# Patient Record
Sex: Female | Born: 1965 | Race: White | Hispanic: No | Marital: Single | Smoking: Current every day smoker
Health system: Southern US, Community
[De-identification: ages and names within clinical notes are randomized; demographics above are authoritative.]

## PROBLEM LIST (undated history)

## (undated) DIAGNOSIS — J439 Emphysema, unspecified: Secondary | ICD-10-CM

---

## 2004-04-17 ENCOUNTER — Inpatient Hospital Stay (HOSPITAL_COMMUNITY): Admission: AD | Admit: 2004-04-17 | Discharge: 2004-04-20 | Payer: Self-pay | Admitting: Psychiatry

## 2005-11-11 ENCOUNTER — Inpatient Hospital Stay (HOSPITAL_COMMUNITY): Admission: RE | Admit: 2005-11-11 | Discharge: 2005-11-14 | Payer: Self-pay | Admitting: Psychiatry

## 2005-11-12 ENCOUNTER — Ambulatory Visit: Payer: Self-pay | Admitting: Psychiatry

## 2008-06-02 ENCOUNTER — Emergency Department (HOSPITAL_COMMUNITY): Admission: EM | Admit: 2008-06-02 | Discharge: 2008-06-03 | Payer: Self-pay | Admitting: Emergency Medicine

## 2011-04-15 NOTE — Discharge Summary (Signed)
[name], [name] NO.:  [id number]   MEDICAL RECORD NO.:  [id number]                   PATIENT TYPE:  IPS   LOCATION:  0506                                 FACILITY:  [facility]   PHYSICIAN:  Geoffery Lyons, M.D.                   DATE OF BIRTH:  1966/02/22   DATE OF ADMISSION:  04/17/2004  DATE OF DISCHARGE:  04/20/2004                                 DISCHARGE SUMMARY   CHIEF COMPLAINT AND PRESENTING ILLNESS:  This was the first admission to  [facility]  for this 45 year old single white female who  lived in a relationship with a man for 13 years and had a child by him.  She  claims she does not know what happened.  She overdosed on her son's  prescribed medications, Darvocet, Neurontin, Zoloft, Risperdal, Celexa and  Wellbutrin.  She had been treated for depression the year before.  Her  younger son was in [place] and that created a lot of pressure for her.  Significant other found her at 8 p.m. on May 19 unresponsive.  Emergency  services were called, she was rushed to [facility].  She was  intubated.  She denied any memory of overdosing.   PAST PSYCHIATRIC HISTORY:  No prior psychiatric treatment, had been an  outpatient in [place].   ALCOHOL AND DRUG HISTORY:  Acknowledges smoking 1-2 packs per day.   PAST MEDICAL HISTORY:  Bulging disk, prescribed Neurontin and hydrocodone.   MEDICATIONS:  Zanaflex 4 mg daily, Zoloft 100 mg daily, Neurontin 300 mg,  and hydrocodone 10/625 mg daily.   PHYSICAL EXAMINATION:  Performed, failed to show any acute findings.   LABORATORY WORKUP:  CBC within normal limits.  Urine drug screen negative.  Blood chemistries were within normal limits.   MENTAL STATUS EXAM:  Revealed an alert, oriented female, scruffy, casual  clothes.  Speech normal rate, rhythm and tone.  Anxious, irritable.  Mood  was anxiety.  Thought processes were clear, rational and goal oriented,  wanting to be  discharged.  Denied any suicidal or homicidal ideations, no  evidence of delusions or hallucinations.  Cognition well preserved.   ADMISSION DIAGNOSES:   AXIS I:  Depressive disorder not otherwise specified.   AXIS II:  No diagnosis.   AXIS III:  Obesity, chronic back pain, aspiration pneumonia.   AXIS IV:  Moderate.   AXIS V:  Global assessment of function upon admission 35, highest global  assessment of function in past year 65-70.   COURSE IN HOSPITAL:  She was admitted and started on intensive individual  and group psychotherapy.  She was given trazodone for sleep.  She was kept  on the Neurontin 300 4 times a day and 600 at night, Zoloft 100 mg per day,  Zanaflex 4 mg every 8 hours, Darvocet 1 every 4 hours as needed for pain.  By May 22, she  claimed she was feeling a lot better, endorsed that the  stress of her son being in [place], still not clear what happened when she  took the overdose, but denied any suicidal or homicidal ideations.  She  worked on [employer].  She insisted that we  discharge, so we went and scheduled a family session with her husband.  She  was able to open up a little bit more in terms of the feelings.  Husband was  supportive and they both felt that she was ready to be discharged so we went  ahead and discharged to outpatient follow-up.   DISCHARGE DIAGNOSES:   AXIS I:  Depressive disorder not otherwise specified.   AXIS II:  No diagnosis.   AXIS III:  Obesity, chronic back pain, bulging disk.   AXIS IV:  Moderate.   AXIS V:  Global assessment of function upon discharge 55-60.   DISCHARGE MEDICATIONS:  1. Neurontin 300 mg 4 times a day and 600 at night.  2. Zoloft 100 mg daily.  3. Trazodone 100 mg at bedtime for sleep.   DISPOSITION:  Follow up [facility].                                               Geoffery Lyons, M.D.    IL/MEDQ  D:  05/12/2004  T:  05/12/2004  Job:  [id number]
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
## 2011-04-15 NOTE — Discharge Summary (Signed)
[name], KOPPENHAVER NO.:  [id number]   MEDICAL RECORD NO.:  [id number]          PATIENT TYPE:  IPS   LOCATION:  0300                          FACILITY:  [facility]   PHYSICIAN:  Geoffery Lyons, M.D.      DATE OF BIRTH:  [birth year]   DATE OF ADMISSION:  11/11/2005  DATE OF DISCHARGE:  11/14/2005                                 DISCHARGE SUMMARY   CHIEF COMPLAINT AND PRESENTING ILLNESS:  This was the first admission to  [facility] for this 45 year old divorced white female,  presented to the [facility] Emergency Room on November 07, 2005.  Boyfriend  had called for an ambulance after he found her unresponsive.  She had  overdosed on pain pills.  She ended up being intubated and stabilized at the  ICU, medically cleared.   PAST PSYCHIATRIC HISTORY:  Several prior overdoses, on in May 2005 and one  in March 2006.  The March overdose was on methadone.  Prior admission to  [facility], and she did have a prior admission to [facility] in 2005.   ALCOHOL AND DRUG HISTORY:  Smokes marijuana at least monthly.  No other  substances.   MEDICAL HISTORY:  Status post overdose, back pain with neuropathy.   MEDICATIONS:  1.  Neurontin 800 mg 3 times a day.  2.  Oxycodone 80 mg twice a day.  3.  Hydrocodone 10/325 four times a day as needed.  4.  Zanaflex __________ mg per day.   PHYSICAL EXAMINATION:  Performed, failed to show any acute findings.   LABORATORY WORKUP:  CBC:  White blood cells 11.4, hemoglobin 15.5.  Blood  chemistries:  Glucose 79.  Liver enzymes:  SGOT 23, SGPT 26, total bilirubin  0.6, hemoglobin A1C 6.3.  TSH 3.106.   MENTAL STATUS EXAM:  Reveals an alert, cooperative female, good eye contact.  Speech was normal in rate, rhythm and tone.  Mood was anxious, affect was  labile, cries easily.  Thought processes were clear, rational and goal  oriented. No active delusions, no acute suicidal or homicidal ideations.  No  hallucinations.  Cognition well preserved.   ADMISSION DIAGNOSES:  AXIS I:  Depressive disorder not otherwise specified,  rule out bipolar II, depressed.  AXIS II:  No diagnosis.  AXIS III:  Chronic back pain.  AXIS IV:  Moderate.  AXIS V:  Upon admission 25-30, highest global assessment of functioning in  the last year 60.   COURSE IN HOSPITAL:  She was admitted.  She was started in individual and  group psychotherapy.  She was given Ambien for sleep.  She was given  Neurontin 800 three times a day. Zanaflex __________ mg per day, Abilify 30  mg per day.  Zanaflex was clarified to Zanaflex 4 mg per day.  Ms. Haston  claimed that she took more pills than she was supposed to take but this was  not a suicide attempt, endorsed that she was taking more opioids than  prescribed, endorsed pain in her back, her legs, her feet.  UDS was negative  for  opioids.  She initially was very reserved, very guarded, not as  spontaneous, some psychomotor retardation, somatically focused.  She claimed  that she had been off the opioids for 5-6 days.  She did admit that she was  taking more than she was prescribed and she was abusing them, and that she  had overdosed on muscle relaxants, said she was wanting opioids for pain.  The husband had told her that if she was back on the opioids she would not  be welcome back.  She was committed to the relationship so she was willing  to try to make it work.  She continued to be somatically focused, anxious,  claimed pain.  On December 18, she was better.  She was in full contact with  reality, endorsed pain, but she was going to deal with the pain with ways  other than opioids.  She was willing not to go back on opioids for the sake  of maintaining the relationship with the husband.  She did admit that she  was abusing them.  The  husband had gotten rid of things she left in the  house.  At that particular time, she was in full contact with reality.  There were  no suicidal or homicidal ideas, no hallucinations, no delusions.  Completely detoxed.  She was willing to pursue further outpatient treatment.   DISCHARGE DIAGNOSES:  AXIS I:  Opioid dependence, depressive disorder not  otherwise specified, rule out bipolar II, depressed.  AXIS II:  No diagnosis.  AXIS III:  Chronic back pain.  AXIS IV:  Moderate.  AXIS V:  Upon discharge 50.   DISCHARGE MEDICATIONS:  1.  Levaquin 750 mg 1 daily.  2.  Augmentin 875 mg 1 twice a day for 6 more days as a preventive to      aspiration pneumonia.  3.  Protonix 40 mg per day.  4.  Ibuprofen 800 mg every 8 hours as needed for pain.  5.  Neurontin 800 one 3 times a day.  6.  Zanaflex 4 mg daily.  7.  Abilify 30 mg 1 daily.   FOLLOWUP:  To be followed at the local mental health center.      Geoffery Lyons, M.D.  Electronically Signed     IL/MEDQ  D:  11/24/2005  T:  11/24/2005  Job:  [id number]

## 2011-04-15 NOTE — H&P (Signed)
[name], [name]                          ACCOUNT NO.:  [id number]   MEDICAL RECORD NO.:  [id number]                   PATIENT TYPE:  IPS   LOCATION:  0506                                 FACILITY:  [facility]   PHYSICIAN:  Geoffery Lyons, M.D.                   DATE OF BIRTH:  [birth year]   DATE OF ADMISSION:  04/17/2004  DATE OF DISCHARGE:                         PSYCHIATRIC ADMISSION ASSESSMENT   IDENTIFYING INFORMATION:  This is an involuntary admission.  This is a 45-  year-old single white female who has lived with the same man for 13 years  and has a child by him.   REASON FOR ADMISSION AND SYMPTOMS:  The patient does not remember what  happened.  She apparently overdosed on her and son's prescribed medications,  Darvocet, Neurontin, Zoloft, Risperdal, Celexa and Wellbutrin.  She has been  treated for depression in the past year.  Her younger son is at [employer].  She  is requesting discharge.  Apparently, the patient's significant other found  her at approximately 8 p.m. on Apr 15, 2004 unresponsive.  Emergency Medical  Services were called.  She was rushed to [facility] where she was  intubated.  She was extubated the morning of Apr 17, 2004 and it was felt  that she should come to the [facility] to assess her  suicidality.  Again, the patient completely denies any memory of overdosing  or why she would have done that and she has no prior history for this.   PAST PSYCHIATRIC HISTORY:  She has no prior psychiatric inpatient history.  She has been in outpatient at [facility] from 2004 to 2005.   SOCIAL HISTORY:  She finished high school.  She has worked at [employer].  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  She has three children, her  oldest son, age 46, and then two younger boys, age 24 and 58.  These are with  her significant other, the man she has lived with for 13 years.  The 9-year-  old is the one who is currently at [facility].   FAMILY HISTORY:   Her father was an alcoholic.   ALCOHOL/DRUG HISTORY:  She acknowledges smoking 1-2 packs per day x 25  years.   PRIMARY CARE PHYSICIAN:  She cannot remember the new Hurley Blevins's name as they  have recently relocated.  However, she is followed by [facility] for bulging disks.  She is prescribed Neurontin and hydrocodone  by them.   CURRENT MEDICATIONS:  Zanaflex 4 mg a day, Zoloft 100 mg q.d., gabapentin  300 mg and hydrocodone 10\325 mg.   ALLERGIES:  No known drug allergies.   PHYSICAL EXAMINATION:  Obese white female who appears significantly older  than her stated age and she is completely edentulous with no compensation.   MENTAL STATUS EXAM:  She is alert and oriented.  As already stated, she  appears vastly older.  She is scruffy.  Her clothing is casual.  Her speech  has normal rate, rhythm and tone.  Her mood is anxious, irritable.  Her  affect is congruent.  Thought processes are clear, rational and goal-  oriented.  She wants discharge.  Again, involuntary admission process was  explained to her.  She currently denies suicidal or homicidal ideation or  auditory or visual hallucinations.  There were no delusions or paranoia  during her exam.  Judgment and insight are poor.  Concentration and memory  are intact.  Intelligence is average.   DIAGNOSES:   AXIS I:  1. 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  2. Aspiration pneumonia.   AXIS II:  Deferred.   AXIS III:  1. Obesity.  2. Chronic back pain.  3. Aspiration pneumonia.   AXIS IV:  Denies.   AXIS V:  25.   PLAN:  The patient will be admitted for further stabilization and  evaluation.  Her antibiotics were discontinued for aspiration pneumonia  prior to transfer.     Mickie Leonarda Salon, P.A.-C.               Geoffery Lyons, M.D.    MD/MEDQ  D:  04/18/2004  T:  04/18/2004  Job:  [id number]

## 2011-08-25 LAB — URINE MICROSCOPIC-ADD ON

## 2011-08-25 LAB — POCT I-STAT, CHEM 8
HCT: 44
Hemoglobin: 15
Potassium: 3.6
Sodium: 138

## 2011-08-25 LAB — CK: Total CK: 527 — ABNORMAL HIGH

## 2011-08-25 LAB — URINALYSIS, ROUTINE W REFLEX MICROSCOPIC
Glucose, UA: NEGATIVE
Ketones, ur: 15 — AB
Protein, ur: 100 — AB

## 2018-03-11 ENCOUNTER — Emergency Department: Payer: Self-pay

## 2018-03-11 ENCOUNTER — Observation Stay: Payer: Self-pay | Admitting: Anesthesiology

## 2018-03-11 ENCOUNTER — Encounter
Admission: EM | Disposition: A | Discharge status: Home / Self Care | Payer: Self-pay | Source: Home / Self Care | Attending: Emergency Medicine

## 2018-03-11 ENCOUNTER — Other Ambulatory Visit: Payer: Self-pay

## 2018-03-11 ENCOUNTER — Encounter: Payer: Self-pay | Admitting: Emergency Medicine

## 2018-03-11 ENCOUNTER — Observation Stay
Admission: EM | Admit: 2018-03-11 | Discharge: 2018-03-13 | Disposition: A | Discharge status: Home / Self Care | Payer: Self-pay | Attending: Surgery | Admitting: Surgery

## 2018-03-11 DIAGNOSIS — R0902 Hypoxemia: Secondary | ICD-10-CM

## 2018-03-11 DIAGNOSIS — J439 Emphysema, unspecified: Secondary | ICD-10-CM | POA: Insufficient documentation

## 2018-03-11 DIAGNOSIS — K3532 Acute appendicitis with perforation and localized peritonitis, without abscess: Secondary | ICD-10-CM

## 2018-03-11 DIAGNOSIS — F17210 Nicotine dependence, cigarettes, uncomplicated: Secondary | ICD-10-CM | POA: Insufficient documentation

## 2018-03-11 DIAGNOSIS — K3580 Unspecified acute appendicitis: Secondary | ICD-10-CM | POA: Diagnosis present

## 2018-03-11 DIAGNOSIS — Z6833 Body mass index (BMI) 33.0-33.9, adult: Secondary | ICD-10-CM | POA: Insufficient documentation

## 2018-03-11 DIAGNOSIS — E669 Obesity, unspecified: Secondary | ICD-10-CM | POA: Insufficient documentation

## 2018-03-11 DIAGNOSIS — K760 Fatty (change of) liver, not elsewhere classified: Secondary | ICD-10-CM | POA: Insufficient documentation

## 2018-03-11 DIAGNOSIS — K3533 Acute appendicitis with perforation and localized peritonitis, with abscess: Principal | ICD-10-CM | POA: Insufficient documentation

## 2018-03-11 DIAGNOSIS — K381 Appendicular concretions: Secondary | ICD-10-CM | POA: Insufficient documentation

## 2018-03-11 HISTORY — PX: LAPAROSCOPIC APPENDECTOMY: SHX408

## 2018-03-11 HISTORY — DX: Emphysema, unspecified: J43.9

## 2018-03-11 LAB — COMPREHENSIVE METABOLIC PANEL
ALT: 33 U/L (ref 14–54)
ANION GAP: 11 (ref 5–15)
AST: 38 U/L (ref 15–41)
Albumin: 4.6 g/dL (ref 3.5–5.0)
Alkaline Phosphatase: 72 U/L (ref 38–126)
BUN: 17 mg/dL (ref 6–20)
CHLORIDE: 101 mmol/L (ref 101–111)
CO2: 21 mmol/L — ABNORMAL LOW (ref 22–32)
Calcium: 9.1 mg/dL (ref 8.9–10.3)
Creatinine, Ser: 0.88 mg/dL (ref 0.44–1.00)
GFR calc Af Amer: >60 mL/min (ref >60)
Glucose, Bld: 133 mg/dL — ABNORMAL HIGH (ref 65–99)
POTASSIUM: 4.3 mmol/L (ref 3.5–5.1)
Sodium: 133 mmol/L — ABNORMAL LOW (ref 135–145)
TOTAL PROTEIN: 8.3 g/dL — AB (ref 6.5–8.1)
Total Bilirubin: 0.8 mg/dL (ref 0.3–1.2)

## 2018-03-11 LAB — CBC
HCT: 42.3 % (ref 35.0–47.0)
Hemoglobin: 14.4 g/dL (ref 12.0–16.0)
MCH: 35.3 pg — ABNORMAL HIGH (ref 26.0–34.0)
MCHC: 34.1 g/dL (ref 32.0–36.0)
MCV: 103.6 fL — AB (ref 80.0–100.0)
Platelets: 303 10*3/uL (ref 150–440)
RBC: 4.08 MIL/uL (ref 3.80–5.20)
RDW: 13.6 % (ref 11.5–14.5)
WBC: 13.5 10*3/uL — AB (ref 3.6–11.0)

## 2018-03-11 LAB — LIPASE, BLOOD: LIPASE: 31 U/L (ref 11–51)

## 2018-03-11 SURGERY — APPENDECTOMY, LAPAROSCOPIC
Anesthesia: General | Site: Abdomen | Wound class: Contaminated

## 2018-03-11 MED ORDER — MIDAZOLAM HCL 2 MG/2ML IJ SOLN
INTRAMUSCULAR | Status: AC
  Filled 2018-03-11: qty 2

## 2018-03-11 MED ORDER — SUCCINYLCHOLINE CHLORIDE 20 MG/ML IJ SOLN
INTRAMUSCULAR | Status: DC | PRN
  Administered 2018-03-11: 100 mg via INTRAVENOUS

## 2018-03-11 MED ORDER — SODIUM CHLORIDE 0.9 % IV BOLUS
1000.0000 mL | Freq: Once | INTRAVENOUS | Status: AC
  Administered 2018-03-11: 1000 mL via INTRAVENOUS

## 2018-03-11 MED ORDER — SODIUM CHLORIDE 0.9 % IR SOLN
Status: DC | PRN
Start: 2018-03-11 — End: 2018-03-11
  Administered 2018-03-11: 1000 mL

## 2018-03-11 MED ORDER — FENTANYL CITRATE (PF) 100 MCG/2ML IJ SOLN
25.0000 ug | INTRAMUSCULAR | Status: DC | PRN
  Administered 2018-03-11 (×2): 25 ug via INTRAVENOUS

## 2018-03-11 MED ORDER — MORPHINE SULFATE (PF) 4 MG/ML IV SOLN
4.0000 mg | Freq: Once | INTRAVENOUS | Status: AC
  Administered 2018-03-11: 4 mg via INTRAVENOUS
  Filled 2018-03-11: qty 1

## 2018-03-11 MED ORDER — PROPOFOL 10 MG/ML IV BOLUS
INTRAVENOUS | Status: AC
  Filled 2018-03-11: qty 20

## 2018-03-11 MED ORDER — PIPERACILLIN-TAZOBACTAM 3.375 G IVPB 30 MIN
3.3750 g | Freq: Once | INTRAVENOUS | Status: AC
  Administered 2018-03-11: 3.375 g via INTRAVENOUS
  Filled 2018-03-11: qty 50

## 2018-03-11 MED ORDER — OXYCODONE HCL 5 MG/5ML PO SOLN: 5.0000 mg | Freq: Once | ORAL | Status: DC | PRN

## 2018-03-11 MED ORDER — ROCURONIUM BROMIDE 50 MG/5ML IV SOLN
INTRAVENOUS | Status: AC
  Filled 2018-03-11: qty 1

## 2018-03-11 MED ORDER — ROCURONIUM BROMIDE 100 MG/10ML IV SOLN
INTRAVENOUS | Status: DC | PRN
  Administered 2018-03-11 (×2): 10 mg via INTRAVENOUS
  Administered 2018-03-11: 40 mg via INTRAVENOUS

## 2018-03-11 MED ORDER — ONDANSETRON HCL 4 MG/2ML IJ SOLN
INTRAMUSCULAR | Status: DC | PRN
  Administered 2018-03-11: 4 mg via INTRAVENOUS

## 2018-03-11 MED ORDER — FENTANYL CITRATE (PF) 100 MCG/2ML IJ SOLN
INTRAMUSCULAR | Status: AC
  Filled 2018-03-11: qty 2

## 2018-03-11 MED ORDER — ONDANSETRON HCL 4 MG/2ML IJ SOLN
INTRAMUSCULAR | Status: AC
  Filled 2018-03-11: qty 2

## 2018-03-11 MED ORDER — HYDROCODONE-ACETAMINOPHEN 5-325 MG PO TABS
1.0000 | ORAL_TABLET | Freq: Four times a day (QID) | ORAL | Status: DC | PRN
  Administered 2018-03-12 – 2018-03-13 (×6): 2 via ORAL
  Filled 2018-03-11 (×6): qty 2

## 2018-03-11 MED ORDER — SUGAMMADEX SODIUM 200 MG/2ML IV SOLN
INTRAVENOUS | Status: AC
  Filled 2018-03-11: qty 2

## 2018-03-11 MED ORDER — PROPOFOL 10 MG/ML IV BOLUS
INTRAVENOUS | Status: DC | PRN
  Administered 2018-03-11: 120 mg via INTRAVENOUS

## 2018-03-11 MED ORDER — IOPAMIDOL (ISOVUE-300) INJECTION 61%
30.0000 mL | Freq: Once | INTRAVENOUS | Status: AC | PRN
  Administered 2018-03-11: 30 mL via ORAL

## 2018-03-11 MED ORDER — ACETAMINOPHEN 10 MG/ML IV SOLN
INTRAVENOUS | Status: DC | PRN
  Administered 2018-03-11: 1000 mg via INTRAVENOUS

## 2018-03-11 MED ORDER — SUCCINYLCHOLINE CHLORIDE 20 MG/ML IJ SOLN
INTRAMUSCULAR | Status: AC
  Filled 2018-03-11: qty 1

## 2018-03-11 MED ORDER — DIPHENHYDRAMINE HCL 25 MG PO CAPS
25.0000 mg | ORAL_CAPSULE | Freq: Every evening | ORAL | Status: DC | PRN
  Administered 2018-03-12: 25 mg via ORAL
  Filled 2018-03-11: qty 1

## 2018-03-11 MED ORDER — OXYCODONE HCL 5 MG PO TABS
5.0000 mg | ORAL_TABLET | Freq: Once | ORAL | Status: DC | PRN
Start: 2018-03-11 — End: 2018-03-13

## 2018-03-11 MED ORDER — BUPIVACAINE HCL (PF) 0.25 % IJ SOLN
INTRAMUSCULAR | Status: AC
  Filled 2018-03-11: qty 30

## 2018-03-11 MED ORDER — ACETAMINOPHEN 10 MG/ML IV SOLN
INTRAVENOUS | Status: AC
  Filled 2018-03-11: qty 100

## 2018-03-11 MED ORDER — DEXAMETHASONE SODIUM PHOSPHATE 10 MG/ML IJ SOLN
INTRAMUSCULAR | Status: DC | PRN
  Administered 2018-03-11: 10 mg via INTRAVENOUS

## 2018-03-11 MED ORDER — KETOROLAC TROMETHAMINE 30 MG/ML IJ SOLN
INTRAMUSCULAR | Status: DC | PRN
  Administered 2018-03-11: 30 mg via INTRAVENOUS

## 2018-03-11 MED ORDER — SUGAMMADEX SODIUM 200 MG/2ML IV SOLN
INTRAVENOUS | Status: DC | PRN
  Administered 2018-03-11: 200 mg via INTRAVENOUS

## 2018-03-11 MED ORDER — DEXAMETHASONE SODIUM PHOSPHATE 10 MG/ML IJ SOLN
INTRAMUSCULAR | Status: AC
  Filled 2018-03-11: qty 1

## 2018-03-11 MED ORDER — HEPARIN SODIUM (PORCINE) 5000 UNIT/ML IJ SOLN
INTRAMUSCULAR | Status: AC
  Filled 2018-03-11: qty 1

## 2018-03-11 MED ORDER — MEPERIDINE HCL 25 MG/ML IJ SOLN: 6.2500 mg | INTRAMUSCULAR | Status: DC | PRN

## 2018-03-11 MED ORDER — PANTOPRAZOLE SODIUM 40 MG PO TBEC
40.0000 mg | DELAYED_RELEASE_TABLET | Freq: Two times a day (BID) | ORAL | Status: DC
  Administered 2018-03-12 – 2018-03-13 (×4): 40 mg via ORAL
  Filled 2018-03-11 (×4): qty 1

## 2018-03-11 MED ORDER — BUPIVACAINE HCL 0.25 % IJ SOLN
INTRAMUSCULAR | Status: DC | PRN
  Administered 2018-03-11: 20 mL

## 2018-03-11 MED ORDER — FENTANYL CITRATE (PF) 100 MCG/2ML IJ SOLN
INTRAMUSCULAR | Status: DC | PRN
  Administered 2018-03-11 (×2): 50 ug via INTRAVENOUS

## 2018-03-11 MED ORDER — ENOXAPARIN SODIUM 30 MG/0.3ML ~~LOC~~ SOLN: 30.0000 mg | SUBCUTANEOUS | Status: DC

## 2018-03-11 MED ORDER — LACTATED RINGERS IV SOLN
INTRAVENOUS | Status: DC | PRN
  Administered 2018-03-11: 20:00:00 via INTRAVENOUS

## 2018-03-11 MED ORDER — KCL IN DEXTROSE-NACL 20-5-0.45 MEQ/L-%-% IV SOLN
INTRAVENOUS | Status: DC
  Administered 2018-03-11 – 2018-03-13 (×2): via INTRAVENOUS
  Filled 2018-03-11 (×4): qty 1000

## 2018-03-11 MED ORDER — MIDAZOLAM HCL 2 MG/2ML IJ SOLN
INTRAMUSCULAR | Status: DC | PRN
  Administered 2018-03-11: 2 mg via INTRAVENOUS

## 2018-03-11 MED ORDER — ONDANSETRON HCL 4 MG PO TABS: 4.0000 mg | ORAL_TABLET | Freq: Three times a day (TID) | ORAL | Status: DC | PRN

## 2018-03-11 MED ORDER — PIPERACILLIN-TAZOBACTAM 3.375 G IVPB
3.3750 g | Freq: Three times a day (TID) | INTRAVENOUS | Status: DC
  Administered 2018-03-12 – 2018-03-13 (×5): 3.375 g via INTRAVENOUS
  Filled 2018-03-11 (×5): qty 50

## 2018-03-11 MED ORDER — KETOROLAC TROMETHAMINE 30 MG/ML IJ SOLN
INTRAMUSCULAR | Status: AC
  Filled 2018-03-11: qty 1

## 2018-03-11 MED ORDER — MORPHINE SULFATE (PF) 4 MG/ML IV SOLN
4.0000 mg | INTRAVENOUS | Status: DC | PRN
  Administered 2018-03-12 – 2018-03-13 (×6): 4 mg via INTRAVENOUS
  Filled 2018-03-11 (×6): qty 1

## 2018-03-11 MED ORDER — ONDANSETRON HCL 4 MG/2ML IJ SOLN
4.0000 mg | Freq: Once | INTRAMUSCULAR | Status: AC
  Administered 2018-03-11: 4 mg via INTRAVENOUS
  Filled 2018-03-11: qty 2

## 2018-03-11 MED ORDER — PROMETHAZINE HCL 25 MG/ML IJ SOLN: 6.2500 mg | INTRAMUSCULAR | Status: DC | PRN

## 2018-03-11 MED ORDER — IOHEXOL 300 MG/ML  SOLN
100.0000 mL | Freq: Once | INTRAMUSCULAR | Status: AC | PRN
  Administered 2018-03-11: 100 mL via INTRAVENOUS

## 2018-03-11 SURGICAL SUPPLY — 43 items
CANISTER SUCT 3000ML PPV (MISCELLANEOUS) ×3 IMPLANT
CHLORAPREP W/TINT 26ML (MISCELLANEOUS) ×3 IMPLANT
CUTTER FLEX LINEAR 45M (STAPLE) ×3 IMPLANT
DEVICE SUTURE ENDOST 10MM (ENDOMECHANICALS) ×2 IMPLANT
DRSG TEGADERM 2-3/8X2-3/4 SM (GAUZE/BANDAGES/DRESSINGS) ×9 IMPLANT
DRSG TELFA 3X8 NADH (GAUZE/BANDAGES/DRESSINGS) ×3 IMPLANT
ELECT REM PT RETURN 9FT ADLT (ELECTROSURGICAL) ×3
ELECTRODE REM PT RTRN 9FT ADLT (ELECTROSURGICAL) ×1 IMPLANT
GLOVE BIO SURGEON STRL SZ7.5 (GLOVE) ×3 IMPLANT
GLOVE INDICATOR 8.0 STRL GRN (GLOVE) ×3 IMPLANT
GOWN STRL REUS W/ TWL LRG LVL3 (GOWN DISPOSABLE) ×2 IMPLANT
GOWN STRL REUS W/TWL LRG LVL3 (GOWN DISPOSABLE) ×6
GRASPER SUT TROCAR 14GX15 (MISCELLANEOUS) ×3 IMPLANT
IRRIGATION STRYKERFLOW (MISCELLANEOUS) ×1 IMPLANT
IRRIGATOR STRYKERFLOW (MISCELLANEOUS) ×3
IV NS 1000ML (IV SOLUTION) ×6
IV NS 1000ML BAXH (IV SOLUTION) ×2 IMPLANT
KIT TURNOVER KIT A (KITS) ×3 IMPLANT
NDL FILTER BLUNT 18X1 1/2 (NEEDLE) ×1 IMPLANT
NDL HYPO 25X1 1.5 SAFETY (NEEDLE) ×1 IMPLANT
NDL INSUFFLATION 14GA 120MM (NEEDLE) ×1 IMPLANT
NEEDLE FILTER BLUNT 18X 1/2SAF (NEEDLE) ×2
NEEDLE FILTER BLUNT 18X1 1/2 (NEEDLE) ×1 IMPLANT
NEEDLE HYPO 25X1 1.5 SAFETY (NEEDLE) ×3 IMPLANT
NEEDLE INSUFFLATION 14GA 120MM (NEEDLE) ×3 IMPLANT
NS IRRIG 500ML POUR BTL (IV SOLUTION) ×3 IMPLANT
PACK LAP CHOLECYSTECTOMY (MISCELLANEOUS) ×3 IMPLANT
PAD DRESSING TELFA 3X8 NADH (GAUZE/BANDAGES/DRESSINGS) ×1 IMPLANT
POUCH ENDO CATCH 10MM SPEC (MISCELLANEOUS) ×3 IMPLANT
RELOAD 45 VASCULAR/THIN (ENDOMECHANICALS) ×3 IMPLANT
RELOAD ENDO STITCH 2.0 (ENDOMECHANICALS) ×12
RELOAD STAPLE 45 2.5 WHT GRN (ENDOMECHANICALS) ×1 IMPLANT
RELOAD SUT SNGL STCH ABSRB 2-0 (ENDOMECHANICALS) IMPLANT
SCISSORS METZENBAUM CVD 33 (INSTRUMENTS) ×3 IMPLANT
SUT ETHILON 5-0 FS-2 18 BLK (SUTURE) ×6 IMPLANT
SUT RELOAD ENDO STITCH 2 48X1 (ENDOMECHANICALS) ×4
SUT VIC AB 0 CT2 27 (SUTURE) ×3 IMPLANT
SUTURE RELOAD END STTCH 2 48X1 (ENDOMECHANICALS) ×4 IMPLANT
SYR 10ML LL (SYRINGE) ×3 IMPLANT
TROCAR XCEL 12X100 BLDLESS (ENDOMECHANICALS) ×3 IMPLANT
TROCAR Z-THREAD FIOS 11X100 BL (TROCAR) ×3 IMPLANT
TROCAR Z-THREAD SLEEVE 11X100 (TROCAR) ×3 IMPLANT
TUBING INSUFFLATION (TUBING) ×3 IMPLANT

## 2018-03-11 NOTE — ED Notes (Signed)
Report from sara, rn.

## 2018-03-11 NOTE — Transfer of Care (Signed)
Immediate Anesthesia Transfer of Care Note  Patient: Cindy DownsBrenda Martha  Procedure(s) Performed: APPENDECTOMY LAPAROSCOPIC (N/A Abdomen)  Patient Location: PACU  Anesthesia Type:General  Level of Consciousness: drowsy and patient cooperative  Airway & Oxygen Therapy: Patient Spontanous Breathing and Patient connected to face mask oxygen  Post-op Assessment: Report given to RN and Post -op Vital signs reviewed and stable  Post vital signs: Reviewed and stable  Last Vitals:  Vitals Value Taken Time  BP 120/67 03/11/2018  9:51 PM  Temp    Pulse 108 03/11/2018  9:51 PM  Resp 0 03/11/2018  9:51 PM  SpO2 92 % 03/11/2018  9:51 PM  Vitals shown include unvalidated device data.  Last Pain:  Vitals:   03/11/18 1601  TempSrc: Oral  PainSc:          Complications: No apparent anesthesia complications

## 2018-03-11 NOTE — Anesthesia Procedure Notes (Signed)
Procedure Name: Intubation Date/Time: 03/11/2018 8:17 PM Performed by: Jonna Clark, CRNA Pre-anesthesia Checklist: Patient identified, Patient being monitored, Timeout performed, Emergency Drugs available and Suction available Patient Re-evaluated:Patient Re-evaluated prior to induction Oxygen Delivery Method: Circle system utilized Preoxygenation: Pre-oxygenation with 100% oxygen Induction Type: IV induction Ventilation: Mask ventilation without difficulty Laryngoscope Size: Mac and 3 Grade View: Grade I Tube type: Oral Tube size: 7.0 mm Number of attempts: 1 Placement Confirmation: ETT inserted through vocal cords under direct vision,  positive ETCO2 and breath sounds checked- equal and bilateral Secured at: 21 cm Tube secured with: Tape Dental Injury: Teeth and Oropharynx as per pre-operative assessment

## 2018-03-11 NOTE — Op Note (Signed)
03/11/2018  9:45 PM  PATIENT:  Charmaine DownsBrenda Fraizer  52 y.o. female  PRE-OPERATIVE DIAGNOSIS:  acute appendicitis  POST-OPERATIVE DIAGNOSIS:  perforated appendix  PROCEDURE:  Procedure(s): APPENDECTOMY LAPAROSCOPIC (N/A)  SURGEON:  Surgeon(s) and Role:    * Salley HewsEly, Shirrell Solinger III, MD - Primary   ASSISTANTS: none   ANESTHESIA:   general  EBL:  Total I/O In: 1050 [IV Piggyback:1050] Out: 5 [Blood:5]   DRAINS: none   LOCAL MEDICATIONS USED:  BUPIVICAINE    DISPOSITION OF SPECIMEN:  PATHOLOGY   DICTATION: .Dragon Dictation with the patient is supine position and after the induction of appropriate general anesthesia the patient's abdomen was prepped with ChloraPrep and draped sterile towels.  The patient was placed in the headdown feet up position.  A small supraumbilical incision was made in standard fashion and carried down through the subcutaneous tissue with blunt dissection.  Using a tracheal hook the abdomen was cannulated with a varies needle.  CO2 was instilled to appropriate pressure measurements.  When approximately 2 L of CO2 were instilled the Veress needle was withdrawn and an 11 mm port placed in the peritoneal cavity.  Intra-abdominal position was confirmed and CO2 was reinsufflated.  The right lower quadrant was interrogated.  The root did appear to be some purulence and some free purulent fluid lying in the lateral gutter.  Left lower quadrant transverse incision was made and the 11 mm port inserted under direct vision.  Suprapubic incision was made and a 12 mm port inserted under direct vision.  Right lower quadrant was again investigated.  The appendix was easily visualized.  There did appear to be a perforation at the very base, actually extending onto the cecum.  The mesoappendix divided with a single application of the Endo GIA stapling device carrying a white load.  The appendix was stuck and very thickened at its base.  It was divided with a portion of the cecum using a  single application of the Endo GIA stapling device carrying a blue load.  The appendix was captured and Endo Catch apparatus removed through the suprapubic incision.  Upon inspection of the staple line appeared to be a 2 or 3 mm area which might represent the remaining portion of the base perforation.  Using the Endo Stitch apparatus the base of the appendix was oversewn using a Z-plasty technique and 2 sutures.  The sutures were tied under direct vision.  The ileal sail was then sutured over the base of the appendix using the Endo Stitch apparatus and a 2-0 suture.  The repair appeared to be satisfactory.  There was no visualization of the base of the appendix.  The area was then copiously suction irrigated.  All ports drawn without difficulty.  The suprapubic incision was closed on the fascial level using a figure-of-eight suture of 0 Vicryl with the suture passer.  The other ports withdrawn without difficulty.  Skin incisions were closed with 5-0 nylon.  The area was infiltrated with 0.25% Marcaine for postoperative pain control.  Sterile dressings were applied.  The patient was then returned to the recovery room having tolerated the procedure well.  Sponge instrument needle count were correct x2 in the operating room.  PLAN OF CARE: Admit for overnight observation  PATIENT DISPOSITION:  PACU - hemodynamically stable.   Tiney Rougealph Ely III, MD

## 2018-03-11 NOTE — ED Notes (Signed)
Surgeon here to see pt

## 2018-03-11 NOTE — ED Provider Notes (Addendum)
[facility] Emergency Department Provider Note   ____________________________________________    I have reviewed the triage vital signs and the nursing notes.   HISTORY  Chief Complaint Abdominal Pain     HPI [name] is a 52 y.o. female who presents with complaints of abdominal pain.  Patient reports yesterday she was feeling sick to her stomach and nauseated with decreased appetite.  Today she complains of right lower quadrant abdominal pain.  She has a history of tubal ligation but no other abdominal surgeries.  Denies fevers.  No recent travel.  No diarrhea.  Positive nausea.  Has not taken anything for this.   Past Medical History:  Diagnosis Date  . Emphysema, unspecified (HCC)     There are no active problems to display for this patient.   History reviewed. No pertinent surgical history.  Prior to Admission medications   Not on File     Allergies Patient has no allergy information on record.  No family history on file.  Social History Social History   Tobacco Use  . Smoking status: Current Every Day Smoker    Packs/day: 1.00    Types: Cigarettes  Substance Use Topics  . Alcohol use: Never    Frequency: Never  . Drug use: Never    Review of Systems  Constitutional: Decreased appetite Eyes: No visual changes.  ENT: No sore throat. Cardiovascular: Denies chest pain. Respiratory: Denies shortness of breath. Gastrointestinal: As above Genitourinary: Negative for dysuria. Musculoskeletal: Negative for back pain. Skin: Negative for rash. Neurological: Negative for headaches   ____________________________________________   PHYSICAL EXAM:  VITAL SIGNS: ED Triage Vitals  Enc Vitals Group     BP 03/11/18 1536 110/76     Pulse Rate 03/11/18 1536 85     Resp 03/11/18 1536 (!) 26     Temp --      Temp Source 03/11/18 1536 Oral     SpO2 03/11/18 1536 94 %     Weight 03/11/18 1538 99.8 kg (220 lb)     Height  03/11/18 1538 1.727 m (5\' 8" )     Head Circumference --      Peak Flow --      Pain Score 03/11/18 1538 8     Pain Loc --      Pain Edu? --      Excl. in GC? --     Constitutional: Alert and oriented  Nose: No congestion/rhinnorhea. Mouth/Throat: Mucous membranes are moist.   Neck:  Painless ROM Cardiovascular: Normal rate, regular rhythm. Grossly normal heart sounds.  Good peripheral circulation. Respiratory: Normal respiratory effort.  No retractions. Lungs CTAB. Gastrointestinal: Right lower quadrant tenderness palpation, no distention, no CVA tenderness. Genitourinary: deferred Musculoskeletal:  Warm and well perfused Neurologic:  Normal speech and language. No gross focal neurologic deficits are appreciated.  Skin:  Skin is warm, dry and intact. No rash noted. Psychiatric: Mood and affect are normal. Speech and behavior are normal.  ____________________________________________   LABS (all labs ordered are listed, but only abnormal results are displayed)  Labs Reviewed  CBC - Abnormal; Notable for the following components:      Result Value   WBC 13.5 (*)    MCV 103.6 (*)    MCH 35.3 (*)    All other components within normal limits  COMPREHENSIVE METABOLIC PANEL - Abnormal; Notable for the following components:   Sodium 133 (*)    CO2 21 (*)    Glucose, Bld 133 (*)  Total Protein 8.3 (*)    All other components within normal limits  LIPASE, BLOOD  URINALYSIS, COMPLETE (UACMP) WITH MICROSCOPIC   ____________________________________________  EKG  ED ECG REPORT I, Jene Everyobert Keiston Manley, the attending physician, personally viewed and interpreted this ECG.  Date: 03/26/2018  Rhythm: normal sinus rhythm QRS Axis: normal Intervals: normal ST/T Wave abnormalities: normal Narrative Interpretation: no evidence of acute ischemia  ____________________________________________  RADIOLOGY  CT abdomen pelvis consistent with perforated  appendicitis ____________________________________________   PROCEDURES  Procedure(s) performed: No  Procedures   Critical Care performed: No ____________________________________________   INITIAL IMPRESSION / ASSESSMENT AND PLAN / ED COURSE  Pertinent labs & imaging results that were available during my care of the patient were reviewed by me and considered in my medical decision making (see chart for details).  Presents with abdominal pain, primarily in the right lower quadrant, she is concerned about appendicitis, which certainly is on the differential.  Diverticulitis is also a possibility as is ureterolithiasis.will obtain labs, give IV morphine, IV Zofran, obtain CT abdomen pelvis and reevaluate.     ----------------------------------------- 6:34 PM on 03/11/2018 ----------------------------------------- CT scan demonstrates perforated appendicitis, discussed with Dr. Everlene FarrierPabon of surgery, IV Zosyn ordered, discussed with patient she asked me to call her father, he did not answer the phone.  She remains n.p.o.  ____________________________________________   FINAL CLINICAL IMPRESSION(S) / ED DIAGNOSES  Final diagnoses:  Appendicitis with perforation        Note:  This document was prepared using Dragon voice recognition software and may include unintentional dictation errors.    Jene EveryKinner, Nadina Fomby, MD 03/11/18 Vertell [id number]    Sirenia Whitis, MD 03/26/18 1539

## 2018-03-11 NOTE — Anesthesia Post-op Follow-up Note (Signed)
Anesthesia QCDR form completed.        

## 2018-03-11 NOTE — ED Notes (Signed)
Report to kristin, rn in or.  

## 2018-03-11 NOTE — H&P (Signed)
Cindy DownsBrenda Cardenas is a 52 y.o. female with complaints of abdominal pain since yesterday.  HPI: She was in her usual state of compromised health with her chronic lung disease until yesterday when she developed some right lower quadrant pain.  She had a low-grade fever and some sweating last night.  She had one episode of a chill.  Pain was moderate over the course of the evening but persisted this morning becoming more severe over the afternoon.  She was anorexic, gagging, but did not vomit.  She said no change in her bowel function.  She denies any previous similar symptoms.  She specifically denies any history of hepatitis, yellow jaundice, pancreatitis, peptic ulcer disease, gallbladder disease, or diverticulitis.  Her only previous surgery was a tubal ligation done laparoscopically.  She has a long-standing history of chronic obstructive lung disease and continues to smoke.  She denies any cardiac history hypertension or diabetes.  CT scan in the emergency room revealed a dilated distended appendix with a probable appendicolith and some extraluminal air consistent with possible perforation.  Surgical service was consulted for possible acute ruptured appendicitis.  Past Medical History:  Diagnosis Date  . Emphysema, unspecified (HCC)    History reviewed. No pertinent surgical history. Social History   Socioeconomic History  . Marital status: Single    Spouse name: Not on file  . Number of children: Not on file  . Years of education: Not on file  . Highest education level: Not on file  Occupational History  . Not on file  Social Needs  . Financial resource strain: Not on file  . Food insecurity:    Worry: Not on file    Inability: Not on file  . Transportation needs:    Medical: Not on file    Non-medical: Not on file  Tobacco Use  . Smoking status: Current Every Day Smoker    Packs/day: 1.00    Types: Cigarettes  Substance and Sexual Activity  . Alcohol use: Never    Frequency:  Never  . Drug use: Never  . Sexual activity: Not on file  Lifestyle  . Physical activity:    Days per week: Not on file    Minutes per session: Not on file  . Stress: Not on file  Relationships  . Social connections:    Talks on phone: Not on file    Gets together: Not on file    Attends religious service: Not on file    Active member of club or organization: Not on file    Attends meetings of clubs or organizations: Not on file    Relationship status: Not on file  Other Topics Concern  . Not on file  Social History Narrative  . Not on file     ROS   PHYSICAL EXAM: BP (!) 130/113   Pulse 99   Temp 97.8 F (36.6 C) (Oral)   Resp 19   Ht 5\' 8"  (1.727 m)   Wt 99.8 kg (220 lb)   SpO2 93%   BMI 33.45 kg/m   Physical Exam HEENT exam is unremarkable.  She has no scleral icterus or pupillary abnormalities.  Her neck is supple with midline trachea  Her chest is clear with very distant breath sounds.  She has no adventitious sounds.  Cardiac exam reveals no murmurs or gallops to my year and she seems to be in normal sinus rhythm.  Her abdomen is soft with some point tenderness right lower quadrant with guarding and slight rebound.  She is moderately distended.  She has hypoactive bowel sounds.  Lower extremity examined.  Full range of motion with some numbness in her feet.  She has good distal pulses.  Neurologic exam reveals no significant abnormalities other than some numbness in both feet.  She has no focal findings otherwise.  Psychiatric exam was normal orientation normal affect.  Impression/Plan: I have independently reviewed her CT scan.  She does appear to have slightly dilated appendix with some stranding and a possible focus of extraluminal air.  There does appear to be a fecalith in the base of the appendix.  Her chronic lung disease appears stable.  This patient presents difficult dilemma.  If there was no question of rupture I think I would observe this from  an antibiotic therapy but with the extraluminal air possibility I do believe she would benefit from surgical intervention.  Risks of surgery were outlined to her in detail.  We will plan to move to the operating room as soon as an operating room is available.  She is in agreement.   Tiney Rouge III, MD  03/11/2018, 7:25 PM

## 2018-03-11 NOTE — Consult Note (Signed)
[facility] Medical Consultation  [name] NWG:[id number] DOB: March 10, 1966 DOA: 03/11/2018 PCP: Patient, No Pcp Per   Requesting physician: Dr Lanae Crumbly Date of consultation: 03/11/18 Reason for consultation: Hypoxia  CHIEF COMPLAINT:   Chief Complaint  Patient presents with  . Abdominal Pain    HISTORY OF PRESENT ILLNESS: [name]  is a 52 y.o. female with a known history of chronic tobacco abuse and COPD.  She is not on oxygen or any medications for COPD, at home. Patient was brought to emergency room yesterday for acute onset of right lower quadrant pain and chills.  Patient was found with a ruptured appendix and she was taken emergently to the operating room.  Postop, patient is doing well but her oxygen saturation on room air is in 80s.  She is now comfortable, at rest; denies any cough, chest pain or shortness of breath. EKG reviewed by myself, is noted without any acute changes.  Blood test, including CBC and CMP is unremarkable, except for elevated WBC at 13.5. Internal medicine service is consulted to evaluate patient for hypoxia.  PAST MEDICAL HISTORY:   Past Medical History:  Diagnosis Date  . Emphysema, unspecified (HCC)     PAST SURGICAL HISTORY: History reviewed. No pertinent surgical history.  SOCIAL HISTORY:  Social History   Tobacco Use  . Smoking status: Current Every Day Smoker    Packs/day: 1.00    Types: Cigarettes  . Smokeless tobacco: Never Used  Substance Use Topics  . Alcohol use: Never    Frequency: Never    FAMILY HISTORY: History reviewed. No pertinent family history.  DRUG ALLERGIES: No Known Allergies  REVIEW OF SYSTEMS:   CONSTITUTIONAL: Positive for chills at home; no fatigue or weakness.  EYES: No blurred or double vision.  EARS, NOSE, AND THROAT: No tinnitus or ear pain.  RESPIRATORY: No cough, shortness of breath, wheezing or hemoptysis.  CARDIOVASCULAR: No chest pain, orthopnea, edema.  GASTROINTESTINAL: No nausea,  vomiting, diarrhea.  Positive for right lower quadrant abdominal pain, status post acute appendicitis.  GENITOURINARY: No dysuria, hematuria.  ENDOCRINE: No polyuria, nocturia,  HEMATOLOGY: No bleeding SKIN: No rash or lesion. MUSCULOSKELETAL: No joint pain or arthritis.   NEUROLOGIC: No focal weakness.  PSYCHIATRY: No anxiety or depression.   MEDICATIONS AT HOME:  Prior to Admission medications   Medication Sig Start Date End Date Taking? Authorizing Provider  acetaminophen (TYLENOL) 500 MG tablet Take 500-1,000 mg by mouth every 6 (six) hours as needed for mild pain or moderate pain.   Yes [provider]  Doxylamine-DM (VICKS NYQUIL COUGH PO) Take 1-2 capsules by mouth at bedtime as needed (cough).   Yes [provider]      PHYSICAL EXAMINATION:   VITAL SIGNS: Blood pressure 104/62, pulse 88, temperature 97.9 F (36.6 C), temperature source Oral, resp. rate 20, height 5\' 8"  (1.727 m), weight 99.8 kg (220 lb), SpO2 (!) 89 %.  GENERAL:  52 y.o.-year-old patient lying in the bed with no acute distress.  EYES: Pupils equal, round, reactive to light and accommodation. No scleral icterus. Extraocular muscles intact.  HEENT: Head atraumatic, normocephalic. Oropharynx and nasopharynx clear.  NECK:  Supple, no jugular venous distention. No thyroid enlargement, no tenderness.  LUNGS: Normal breath sounds bilaterally, no wheezing, rales,rhonchi or crepitation. No use of accessory muscles of respiration.  CARDIOVASCULAR: S1, S2 normal. No S3/S4.  ABDOMEN: Positive for right lower quadrant tenderness with palpation, status post appendectomy.  Otherwise, abdomen is soft, nondistended. Bowel sounds present. No  organomegaly or mass.  EXTREMITIES: No pedal edema, cyanosis, or clubbing.  NEUROLOGIC: No focal weakness.  PSYCHIATRIC: The patient is alert and oriented x 3.  SKIN: No obvious rash,  or ulcer.   LABORATORY PANEL:   CBC Recent Labs  Lab 03/11/18 1547  WBC 13.5*   HGB 14.4  HCT 42.3  PLT 303  MCV 103.6*  MCH 35.3*  MCHC 34.1  RDW 13.6   ------------------------------------------------------------------------------------------------------------------  Chemistries  Recent Labs  Lab 03/11/18 1547  NA 133*  K 4.3  CL 101  CO2 21*  GLUCOSE 133*  BUN 17  CREATININE 0.88  CALCIUM 9.1  AST 38  ALT 33  ALKPHOS 72  BILITOT 0.8   ------------------------------------------------------------------------------------------------------------------ estimated creatinine clearance is 91.4 mL/min (by C-G formula based on SCr of 0.88 mg/dL). ------------------------------------------------------------------------------------------------------------------ No results for input(s): TSH, T4TOTAL, T3FREE, THYROIDAB in the last 72 hours.  Invalid input(s): FREET3   Coagulation profile No results for input(s): INR, PROTIME in the last 168 hours. ------------------------------------------------------------------------------------------------------------------- No results for input(s): DDIMER in the last 72 hours. -------------------------------------------------------------------------------------------------------------------  Cardiac Enzymes No results for input(s): CKMB, TROPONINI, MYOGLOBIN in the last 168 hours.  Invalid input(s): CK ------------------------------------------------------------------------------------------------------------------ Invalid input(s): POCBNP  ---------------------------------------------------------------------------------------------------------------  Urinalysis    Component Value Date/Time   COLORURINE AMBER BIOCHEMICALS MAY BE AFFECTED BY COLOR (A) 06/02/2008 2228   APPEARANCEUR CLOUDY (A) 06/02/2008 2228   LABSPEC 1.033 (H) 06/02/2008 2228   PHURINE 5.5 06/02/2008 2228   GLUCOSEU NEGATIVE 06/02/2008 2228   HGBUR LARGE (A) 06/02/2008 2228   BILIRUBINUR SMALL (A) 06/02/2008 2228   KETONESUR 15 (A)  06/02/2008 2228   PROTEINUR 100 (A) 06/02/2008 2228   UROBILINOGEN 1.0 06/02/2008 2228   NITRITE NEGATIVE 06/02/2008 2228   LEUKOCYTESUR SMALL (A) 06/02/2008 2228     RADIOLOGY: Ct Abdomen Pelvis W Contrast  Result Date: 03/11/2018 CLINICAL DATA:  Right lower quadrant abdominal pain EXAM: CT ABDOMEN AND PELVIS WITH CONTRAST TECHNIQUE: Multidetector CT imaging of the abdomen and pelvis was performed using the standard protocol following bolus administration of intravenous contrast. CONTRAST:  OMNIPAQUE IOHEXOL 300 MG/ML  SOLN COMPARISON:  None. FINDINGS: Lower chest: Lung bases demonstrate moderate emphysema and mild subpleural fibrosis. No acute consolidation or effusion. Normal heart size. Small hiatal hernia. Hepatobiliary: Steatosis. Dilated gallbladder without calcified stones. No biliary dilatation Pancreas: Unremarkable. No pancreatic ductal dilatation or surrounding inflammatory changes. Spleen: Normal in size without focal abnormality. Adrenals/Urinary Tract: Adrenal glands are within normal limits. No hydronephrosis. Focal scarring in the mid pole of the left kidney. Bladder normal Stomach/Bowel: Stomach is nonenlarged. No dilated small bowel. Mild wall thickening of the cecum. Abnormal appearance of the appendix. Appendix enlarged at 11 mm. Moderate right lower quadrant inflammatory changes. Small focus of suspected extraluminal gas adjacent to the appendix, series 2 image number 65. Probable small stone at the origin of the appendix. Vascular/Lymphatic: Moderate aortic atherosclerosis. No aneurysmal dilatation. No significantly enlarged lymph nodes. Reproductive: Uterus and bilateral adnexa are unremarkable. Other: Trace free fluid in the pelvis. Small fat in the umbilical region Musculoskeletal: Diffuse degenerative changes of the spine. IMPRESSION: 1. Findings consistent with acute appendicitis. A small extraluminal gas collection is suspected adjacent to the appendix, concerning for a  contained perforation. Mild wall thickening of adjacent cecum with moderate right lower quadrant inflammatory changes. Appendix: Location: Right lower quadrant Diameter: 11 mm Appendicolith: Probable small appendicolith at the origin of the appendix Mucosal hyper-enhancement: Present Extraluminal gas: Small focal extraluminal gas collection adjacent to the appendix Periappendiceal  collection: Moderate edema in the periappendiceal fat but no rim enhancing organized fluid collection adjacent to the appendix. 2.  Hepatic steatosis 3.   Dilated gallbladder Electronically Signed   By: Jasmine PangKim  Fujinaga M.D.   On: 03/11/2018 18:13    EKG: Orders placed or performed during the hospital encounter of 03/11/18  . EKG 12-Lead  . EKG 12-Lead    IMPRESSION AND PLAN:  1.  Acute perforated appendicitis, status post appendectomy.  Continue management per surgical team. 2.  Acute respiratory failure with hypoxia, postop, likely secondary to pain and anesthesia meds in a patient with moderate COPD.  Continue oxygen therapy, 2 L per nasal cannula.  Continue incentive spirometry.  Continue to monitor and on telemetry.  We will start Combivent inhaler and duo nebs every 6 hours, as needed for shortness of breath and wheezing.  Will check chest x-ray for further evaluation.   3.  COPD. We will start Combivent inhaler and duo nebs every 6 hours, as needed for shortness of breath and wheezing.  Will check chest x-ray for further evaluation.  Patient should follow-up as outpatient with pulmonary function test.   All the records are reviewed and case discussed with General Surgery provider. Management plans discussed with the patient, family and they are in agreement.  CODE STATUS: FULL    TOTAL TIME TAKING CARE OF THIS PATIENT: 45 minutes.    Cammy CopaAngela Mariaisabel Bodiford M.D on 03/11/2018 at 11:39 PM  Between 7am to 6pm - Pager - [phone]  After 6pm go to [email or web address] - password [email or web address] [facility]  [facility] Office   [phone]  CC: Primary care physician; Patient, No Pcp Per

## 2018-03-11 NOTE — Progress Notes (Signed)
Pharmacy Antibiotic Note  Charmaine DownsBrenda Dorce is a [age] y.o. female admitted on 03/11/2018 with intra-abdominal infection.  Pharmacy has been consulted for zosyn dosing.  Plan: Zosyn 3.375g IV q8h (4 hour infusion).  Height: 5\' 8"  (172.7 cm) Weight: 220 lb (99.8 kg) IBW/kg (Calculated) : 63.9  Temp (24hrs), Avg:98.7 F (37.1 C), Min:97.8 F (36.6 C), Max:99.3 F (37.4 C)  Recent Labs  Lab 03/11/18 1547  WBC 13.5*  CREATININE 0.88    Estimated Creatinine Clearance: 91.4 mL/min (by C-G formula based on SCr of 0.88 mg/dL).    No Known Allergies  Thank you for allowing pharmacy to be a part of this patient's care.  Thomasene Rippleavid Ry Moody, PharmD, BCPS Clinical Pharmacist 03/11/2018

## 2018-03-11 NOTE — ED Triage Notes (Signed)
Pt arrived via EMS from home c/o RLQ abdominal pain since 1400.  States she was at home standing and she felt a pop.  Since then she has had severe abdominal pain with vomiting.  Denies diarrhea.

## 2018-03-11 NOTE — Anesthesia Preprocedure Evaluation (Signed)
Anesthesia Evaluation  Patient identified by MRN, date of birth, ID band Patient awake    Reviewed: Allergy & Precautions, NPO status , Patient's Chart, lab work & pertinent test results  History of Anesthesia Complications Negative for: history of anesthetic complications  Airway Mallampati: II  TM Distance: >3 FB Neck ROM: Full    Dental  (+) Edentulous Upper, Edentulous Lower   Pulmonary neg sleep apnea, COPD,  COPD inhaler, Current Smoker,    breath sounds clear to auscultation- rhonchi (-) wheezing      Cardiovascular (-) hypertension(-) CAD, (-) Past MI, (-) Cardiac Stents and (-) CABG  Rhythm:Regular Rate:Normal - Systolic murmurs and - Diastolic murmurs    Neuro/Psych negative neurological ROS  negative psych ROS   GI/Hepatic negative GI ROS, Neg liver ROS,   Endo/Other  negative endocrine ROSneg diabetes  Renal/GU negative Renal ROS     Musculoskeletal negative musculoskeletal ROS (+)   Abdominal (+) + obese,   Peds  Hematology negative hematology ROS (+)   Anesthesia Other Findings Past Medical History: No date: Emphysema, unspecified (HCC)   Reproductive/Obstetrics                             Anesthesia Physical Anesthesia Plan  ASA: II and emergent  Anesthesia Plan: General   Post-op Pain Management:    Induction: Intravenous and Rapid sequence  PONV Risk Score and Plan: 1 and Ondansetron and Midazolam  Airway Management Planned: Oral ETT  Additional Equipment:   Intra-op Plan:   Post-operative Plan: Extubation in OR  Informed Consent: I have reviewed the patients History and Physical, chart, labs and discussed the procedure including the risks, benefits and alternatives for the proposed anesthesia with the patient or authorized representative who has indicated his/her understanding and acceptance.   Dental advisory given  Plan Discussed with: CRNA and  Anesthesiologist  Anesthesia Plan Comments:         Anesthesia Quick Evaluation

## 2018-03-12 ENCOUNTER — Encounter: Payer: Self-pay | Admitting: Surgery

## 2018-03-12 ENCOUNTER — Observation Stay: Payer: Self-pay

## 2018-03-12 LAB — BASIC METABOLIC PANEL
Anion gap: 8 (ref 5–15)
BUN: 18 mg/dL (ref 6–20)
CHLORIDE: 106 mmol/L (ref 101–111)
CO2: 20 mmol/L — AB (ref 22–32)
CREATININE: 1 mg/dL (ref 0.44–1.00)
Calcium: 7.7 mg/dL — ABNORMAL LOW (ref 8.9–10.3)
GFR calc non Af Amer: >60 mL/min (ref >60)
GLUCOSE: 166 mg/dL — AB (ref 65–99)
Potassium: 4.7 mmol/L (ref 3.5–5.1)
Sodium: 134 mmol/L — ABNORMAL LOW (ref 135–145)

## 2018-03-12 LAB — CBC
HEMATOCRIT: 35.3 % (ref 35.0–47.0)
HEMOGLOBIN: 11.9 g/dL — AB (ref 12.0–16.0)
MCH: 34.9 pg — AB (ref 26.0–34.0)
MCHC: 33.6 g/dL (ref 32.0–36.0)
MCV: 103.8 fL — AB (ref 80.0–100.0)
Platelets: 203 10*3/uL (ref 150–440)
RBC: 3.4 MIL/uL — ABNORMAL LOW (ref 3.80–5.20)
RDW: 13.6 % (ref 11.5–14.5)
WBC: 13.7 10*3/uL — ABNORMAL HIGH (ref 3.6–11.0)

## 2018-03-12 MED ORDER — ENOXAPARIN SODIUM 40 MG/0.4ML ~~LOC~~ SOLN
40.0000 mg | SUBCUTANEOUS | Status: DC
  Administered 2018-03-12 – 2018-03-13 (×2): 40 mg via SUBCUTANEOUS
  Filled 2018-03-12 (×2): qty 0.4

## 2018-03-12 MED ORDER — IPRATROPIUM-ALBUTEROL 20-100 MCG/ACT IN AERS: 1.0000 | INHALATION_SPRAY | Freq: Four times a day (QID) | RESPIRATORY_TRACT | Status: DC

## 2018-03-12 MED ORDER — IPRATROPIUM-ALBUTEROL 0.5-2.5 (3) MG/3ML IN SOLN: 3.0000 mL | Freq: Four times a day (QID) | RESPIRATORY_TRACT | Status: DC | PRN

## 2018-03-12 MED ORDER — NICOTINE 21 MG/24HR TD PT24
21.0000 mg | MEDICATED_PATCH | Freq: Every day | TRANSDERMAL | Status: DC
  Administered 2018-03-12 – 2018-03-13 (×2): 21 mg via TRANSDERMAL
  Filled 2018-03-12 (×2): qty 1

## 2018-03-12 MED ORDER — IPRATROPIUM-ALBUTEROL 0.5-2.5 (3) MG/3ML IN SOLN
3.0000 mL | Freq: Four times a day (QID) | RESPIRATORY_TRACT | Status: DC
  Administered 2018-03-12 – 2018-03-13 (×5): 3 mL via RESPIRATORY_TRACT
  Filled 2018-03-12 (×6): qty 3

## 2018-03-12 NOTE — Progress Notes (Signed)
Lovenox dose adjustment to 40 mg subq daily for CrCl > 30 ml/min (CrCl 91.4 ml/min)  Thomasene Rippleavid Aidian Salomon, PharmD, BCPS Clinical Pharmacist 03/12/2018

## 2018-03-12 NOTE — Anesthesia Postprocedure Evaluation (Signed)
Anesthesia Post Note  Patient: [name]  Procedure(s) Performed: APPENDECTOMY LAPAROSCOPIC (N/A Abdomen)  Patient location during evaluation: PACU Anesthesia Type: General Level of consciousness: awake and alert and oriented Pain management: pain level controlled Vital Signs Assessment: post-procedure vital signs reviewed and stable Respiratory status: spontaneous breathing, nonlabored ventilation and respiratory function stable Cardiovascular status: blood pressure returned to baseline and stable Postop Assessment: no signs of nausea or vomiting Anesthetic complications: no     Last Vitals:  Vitals:   03/11/18 2258 03/11/18 2358  BP: 104/62 107/69  Pulse: 88 89  Resp: 20   Temp: 36.6 C 36.8 C  SpO2: (!) 89% 92%    Last Pain:  Vitals:   03/12/18 0111  TempSrc:   PainSc: 2                  Darria Corvera

## 2018-03-12 NOTE — Progress Notes (Signed)
SATURATION QUALIFICATIONS: This note is to check if pt need oxygen  Patient Saturations on Room Air at Rest = 92%  Patient Saturations on Room Air while Ambulating = 90%  Patient Saturations on 2 Liters of oxygen while Ambulating = 94%  Please briefly explain why patient needs home oxygen: Pt does not need 2 L of oxygen. O2 sats maintain normal levels.

## 2018-03-12 NOTE — Progress Notes (Signed)
SURGICAL PROGRESS NOTE  Hospital Day(s): 0.   Post op day(s): 1 Day Post-Op.   Interval History: Patient seen and examined, no acute events or new complaints s/p laparoscopic appendectomy overnight for acute appendicitis with perforation at appendiceal base. Patient reports RLQ > LLQ abdominal pain well-controlled with +flatus and tolerating clear liquids diet without N/V, fever/chills, CP, or SOB with and more recently this morning without supplemental oxygen since nasal canula removed. Patient otherwise states she has so far only ambulated in her room.  Review of Systems:  Constitutional: denies fever, chills  HEENT: denies cough or congestion  Respiratory: denies any shortness of breath  Cardiovascular: denies chest pain or palpitations  Gastrointestinal: abdominal pain, N/V, and bowel function as per interval history Genitourinary: denies burning with urination or urinary frequency Musculoskeletal: denies pain, decreased motor or sensation Integumentary: denies any other rashes or skin discolorations except post-surgical abdominal wounds Neurological: denies HA or vision/hearing changes   Vital signs in last 24 hours: [min-max] current  Temp:  [97.8 F (36.6 C)-99.3 F (37.4 C)] 98.6 F (37 C) (04/15 0348) Pulse Rate:  [83-108] 83 (04/15 0348) Resp:  [15-26] 21 (04/15 0348) BP: (96-130)/(62-113) 102/62 (04/15 0348) SpO2:  [89 %-97 %] 90 % (04/15 0348) Weight:  [220 lb (99.8 kg)] 220 lb (99.8 kg) (04/14 1538)     Height: 5\' 8"  (172.7 cm) Weight: 220 lb (99.8 kg) BMI (Calculated): 33.46   Intake/Output this shift:  Total I/O In: -  Out: 300 [Urine:300]   Intake/Output last 2 shifts:  @IOLAST2SHIFTS @   Physical Exam:  Constitutional: alert, cooperative and no distress  HENT: normocephalic without obvious abnormality  Eyes: PERRL, EOM's grossly intact and symmetric  Neuro: CN II - XII grossly intact and symmetric without deficit  Respiratory: breathing non-labored at  rest  Cardiovascular: regular rate and sinus rhythm  Gastrointestinal: soft and obese, though no obvious distention, mild-/moderate- RLQ > LLQ abdominal tenderness to palpation with incisions well-approximated without surrounding erythema or drainage, without guarding or rebound tenderness Musculoskeletal: UE and LE FROM, motor and sensation grossly intact, NT   Labs:  CBC Latest Ref Rng & Units 03/12/2018 03/11/2018 06/02/2008  WBC 3.6 - 11.0 K/uL 13.7(H) 13.5(H) -  Hemoglobin 12.0 - 16.0 g/dL 11.9(L) 14.4 15.0  Hematocrit 35.0 - 47.0 % 35.3 42.3 44.0  Platelets 150 - 440 K/uL 203 303 -   CMP Latest Ref Rng & Units 03/12/2018 03/11/2018 06/02/2008  Glucose 65 - 99 mg/dL 540(J) 811(B) 147(W)  BUN 6 - 20 mg/dL 18 17 22   Creatinine 0.44 - 1.00 mg/dL 2.95 6.21 0.9  Sodium 308 - 145 mmol/L 134(L) 133(L) 138  Potassium 3.5 - 5.1 mmol/L 4.7 4.3 3.6  Chloride 101 - 111 mmol/L 106 101 111  CO2 22 - 32 mmol/L 20(L) 21(L) -  Calcium 8.9 - 10.3 mg/dL 7.7(L) 9.1 -  Total Protein 6.5 - 8.1 g/dL - 8.3(H) -  Total Bilirubin 0.3 - 1.2 mg/dL - 0.8 -  Alkaline Phos 38 - 126 U/L - 72 -  AST 15 - 41 U/L - 38 -  ALT 14 - 54 U/L - 33 -   Imaging studies: No new pertinent imaging studies   Assessment/Plan: (ICD-10's: K35.32) 52 y.o. female doing overall well 1 Day Post-Op s/p laparoscopic appendectomy with partial cececotomy for acute appendicitis with perforation at appendiceal base with small amount of peri-appendiceal purulent fluid, complicated by leukocytosis and by comorbidities including obesity (BMI >33), COPD not yet on home supplemental oxygen, and chronic  ongoing tobacco abuse (smoking).   - pain control prn   - check/trend wbc tomorrow morning   - monitor abdominal exam and bowel function  - discussed with Dr. Michela PitcherEly, will continue IV antibiotics another 24 hours or until normalization of WBC   - complete course of oral antibiotics upon discharge for perforated appendicitis  - medical management of  medical comorbidities  - DVT prophylaxis, ambulation encouraged  - smoking cessation strongly encouraged  All of the above findings and recommendations were discussed with the patient, and all of patient's questions were answered to her expressed satisfaction.  -- Scherrie GerlachJason E. Earlene Plateravis, MD, RPVI [place]: [facility] - Partnering for exceptional care. Office: [phone]

## 2018-03-12 NOTE — Progress Notes (Signed)
Patient ID: [name], female   DOB: 05/25/1966, 52 y.o.   MRN: [id number]  [facility] PROGRESS NOTE  Cindy DownsBrenda Cardenas WUJ:[id number] DOB: 07/14/1966 DOA: 03/11/2018 PCP: Patient, No Pcp Per  HPI/Subjective: Patient feeling better.  5 out of 10 soreness in her abdomen after surgery.  Asking for a nicotine patch.  She was sitting up in bed starting to eat clear liquid breakfast.  Objective: Vitals:   03/11/18 2358 03/12/18 0348  BP: 107/69 102/62  Pulse: 89 83  Resp:  (!) 21  Temp: 98.2 F (36.8 C) 98.6 F (37 C)  SpO2: 92% 90%    Filed Weights   03/11/18 1538  Weight: 99.8 kg (220 lb)    ROS: Review of Systems  Constitutional: Negative for chills and fever.  Eyes: Negative for blurred vision.  Respiratory: Negative for cough and shortness of breath.   Cardiovascular: Negative for chest pain.  Gastrointestinal: Positive for abdominal pain. Negative for constipation, diarrhea, nausea and vomiting.  Genitourinary: Negative for dysuria.  Musculoskeletal: Negative for joint pain.  Neurological: Negative for dizziness and headaches.   Exam: Physical Exam  Constitutional: She is oriented to person, place, and time.  HENT:  Nose: No mucosal edema.  Mouth/Throat: No oropharyngeal exudate or posterior oropharyngeal edema.  Eyes: Pupils are equal, round, and reactive to light. Conjunctivae, EOM and lids are normal.  Neck: No JVD present. Carotid bruit is not present. No edema present. No thyroid mass and no thyromegaly present.  Cardiovascular: S1 normal and S2 normal. Exam reveals no gallop.  No murmur heard. Pulses:      Dorsalis pedis pulses are 2+ on the right side, and 2+ on the left side.  Respiratory: No respiratory distress. She has decreased breath sounds in the right lower field and the left lower field. She has no wheezes. She has no rhonchi. She has no rales.  GI: Soft. Bowel sounds are normal. There is no tenderness.  Musculoskeletal:       Right ankle: She  exhibits no swelling.       Left ankle: She exhibits no swelling.  Lymphadenopathy:    She has no cervical adenopathy.  Neurological: She is alert and oriented to person, place, and time. No cranial nerve deficit.  Skin: Skin is warm. No rash noted. Nails show no clubbing.  Psychiatric: She has a normal mood and affect.      Data Reviewed: Basic Metabolic Panel: Recent Labs  Lab 03/11/18 1547 03/12/18 0545  NA 133* 134*  K 4.3 4.7  CL 101 106  CO2 21* 20*  GLUCOSE 133* 166*  BUN 17 18  CREATININE 0.88 1.00  CALCIUM 9.1 7.7*   Liver Function Tests: Recent Labs  Lab 03/11/18 1547  AST 38  ALT 33  ALKPHOS 72  BILITOT 0.8  PROT 8.3*  ALBUMIN 4.6   Recent Labs  Lab 03/11/18 1547  LIPASE 31   CBC: Recent Labs  Lab 03/11/18 1547 03/12/18 0545  WBC 13.5* 13.7*  HGB 14.4 11.9*  HCT 42.3 35.3  MCV 103.6* 103.8*  PLT 303 203    Studies: Dg Chest 2 View  Result Date: 03/12/2018 CLINICAL DATA:  Hypoxia. EXAM: CHEST - 2 VIEW COMPARISON:  None. FINDINGS: The heart size and mediastinal contours are within normal limits. No pneumothorax or significant pleural effusion is noted. Moderate bibasilar subsegmental atelectasis or infiltrates are noted. Emphysematous disease is noted in the upper lobes bilaterally. The visualized skeletal structures are unremarkable. IMPRESSION: Moderate bibasilar subsegmental atelectasis or infiltrates.  Emphysema (ICD10-J43.9). Electronically Signed   By: Lupita Raider, M.D.   On: 03/12/2018 08:00   Ct Abdomen Pelvis W Contrast  Result Date: 03/11/2018 CLINICAL DATA:  Right lower quadrant abdominal pain EXAM: CT ABDOMEN AND PELVIS WITH CONTRAST TECHNIQUE: Multidetector CT imaging of the abdomen and pelvis was performed using the standard protocol following bolus administration of intravenous contrast. CONTRAST:  OMNIPAQUE IOHEXOL 300 MG/ML  SOLN COMPARISON:  None. FINDINGS: Lower chest: Lung bases demonstrate moderate emphysema and mild  subpleural fibrosis. No acute consolidation or effusion. Normal heart size. Small hiatal hernia. Hepatobiliary: Steatosis. Dilated gallbladder without calcified stones. No biliary dilatation Pancreas: Unremarkable. No pancreatic ductal dilatation or surrounding inflammatory changes. Spleen: Normal in size without focal abnormality. Adrenals/Urinary Tract: Adrenal glands are within normal limits. No hydronephrosis. Focal scarring in the mid pole of the left kidney. Bladder normal Stomach/Bowel: Stomach is nonenlarged. No dilated small bowel. Mild wall thickening of the cecum. Abnormal appearance of the appendix. Appendix enlarged at 11 mm. Moderate right lower quadrant inflammatory changes. Small focus of suspected extraluminal gas adjacent to the appendix, series 2 image number 65. Probable small stone at the origin of the appendix. Vascular/Lymphatic: Moderate aortic atherosclerosis. No aneurysmal dilatation. No significantly enlarged lymph nodes. Reproductive: Uterus and bilateral adnexa are unremarkable. Other: Trace free fluid in the pelvis. Small fat in the umbilical region Musculoskeletal: Diffuse degenerative changes of the spine. IMPRESSION: 1. Findings consistent with acute appendicitis. A small extraluminal gas collection is suspected adjacent to the appendix, concerning for a contained perforation. Mild wall thickening of adjacent cecum with moderate right lower quadrant inflammatory changes. Appendix: Location: Right lower quadrant Diameter: 11 mm Appendicolith: Probable small appendicolith at the origin of the appendix Mucosal hyper-enhancement: Present Extraluminal gas: Small focal extraluminal gas collection adjacent to the appendix Periappendiceal collection: Moderate edema in the periappendiceal fat but no rim enhancing organized fluid collection adjacent to the appendix. 2.  Hepatic steatosis 3.   Dilated gallbladder Electronically Signed   By: Jasmine Pang M.D.   On: 03/11/2018 18:13     Scheduled Meds: . enoxaparin (LOVENOX) injection  40 mg Subcutaneous Q24H  . ipratropium-albuterol  3 mL Nebulization Q6H  . nicotine  21 mg Transdermal Daily  . pantoprazole  40 mg Oral BID   Continuous Infusions: . dextrose 5 % and 0.45 % NaCl with KCl 20 mEq/L 75 mL/hr at 03/11/18 2342  . piperacillin-tazobactam (ZOSYN)  IV Stopped (03/12/18 6045)    Assessment/Plan:   1. Acute respiratory failure with hypoxia postoperatively.  Patient was sitting up in bed with her oxygen off.  Asked nursing staff to check a pulse ox on room air to see if we can get off oxygen. 2. COPD.  Continue nebulizer treatments. 3. Tobacco abuse.  Smoking cessation done 4 minutes by me.  Nicotine patch applied 4. Acute perforated appendicitis status post appendectomy.  Antibiotics and disposition of to surgical team    Disposition Plan: As per surgical team  Antibiotics:  Zosyn  Time spent: 24 minutes  Freyja Govea [employer]

## 2018-03-13 LAB — CBC
HEMATOCRIT: 34.3 % — AB (ref 35.0–47.0)
HEMOGLOBIN: 11.4 g/dL — AB (ref 12.0–16.0)
MCH: 35 pg — ABNORMAL HIGH (ref 26.0–34.0)
MCHC: 33.2 g/dL (ref 32.0–36.0)
MCV: 105.3 fL — AB (ref 80.0–100.0)
Platelets: 180 10*3/uL (ref 150–440)
RBC: 3.25 MIL/uL — ABNORMAL LOW (ref 3.80–5.20)
RDW: 13.6 % (ref 11.5–14.5)
WBC: 9.1 10*3/uL (ref 3.6–11.0)

## 2018-03-13 LAB — SURGICAL PATHOLOGY

## 2018-03-13 MED ORDER — AMOXICILLIN-POT CLAVULANATE 875-125 MG PO TABS: 1.0000 | ORAL_TABLET | Freq: Two times a day (BID) | ORAL | 0 refills | Status: AC

## 2018-03-13 MED ORDER — HYDROCODONE-ACETAMINOPHEN 5-325 MG PO TABS: 1.0000 | ORAL_TABLET | Freq: Four times a day (QID) | ORAL | 0 refills | Status: DC | PRN

## 2018-03-13 NOTE — Discharge Instructions (Addendum)
In addition to included general post-operative instructions for Laparoscopic Appendectomy,  Diet: Resume home heart healthy diet.   Activity: No heavy lifting >15 - 20 pounds (children, pets, laundry, garbage) or strenuous activity until follow-up, but light activity and walking are encouraged. Do not drive or drink alcohol if taking narcotic pain medications.  Wound care: Remove dressing in 2 days unless otherwise instructed. Once dressing removed, 2 days after surgery (Wednesday, 4/17), may shower/get incision wet with soapy water and pat dry (do not rub incisions), but no baths or submerging incision underwater until follow-up.   Medications: Resume all home medications. For mild to moderate pain: acetaminophen (Tylenol) or ibuprofen/naproxen (if no kidney disease). Combining Tylenol with alcohol can substantially increase your risk of causing liver disease. Narcotic pain medications, if prescribed, can be used for severe pain, though may cause nausea, constipation, and drowsiness. Do not combine Tylenol and Percocet (or similar) within a 6 hour period as Percocet (and similar) contain(s) Tylenol. If you do not need the narcotic pain medication, you do not need to fill the prescription.  Call office [phone]) at any time if any questions, worsening pain, fevers/chills, bleeding, drainage from incision site, or other concerns.

## 2018-03-13 NOTE — Progress Notes (Signed)
SATURATION QUALIFICATIONS: (This note is used to comply with regulatory documentation for home oxygen)  Patient Saturations on Room Air at Rest = 92 %  Patient Saturations on Room Air while Ambulating = 90 %  Patient Saturations on 2 Liters of oxygen while Ambulating = 94 %  Please briefly explain why patient needs home oxygen: Pt does not need oxygen therapy at this time,

## 2018-03-13 NOTE — Care Management (Signed)
Patient to discharge home today.  Patient weaned to RA.  Patient does not have PCP.  Provided with application to Medication Management , [facility], and "The Network:  Your Guide to [employer] [facility] [employer] [place]"  Booklet.  Offered to print coupons from [email or web address].  Patient declined.  Patient states that she rather get her medication like [facility].  Patient denies issues obtaining medications.  RNCM signing off.

## 2018-03-13 NOTE — Progress Notes (Signed)
Pt was given D/C instructions and prescriptions. [facility] and [facility] was [facility] without incident. She was D/C to her aunt. She was given information for the [facility] and other financial counseling.

## 2018-03-16 NOTE — Discharge Summary (Signed)
Physician Discharge Summary  Patient ID: Cindy DownsBrenda Cardenas MRN: [id number] DOB/AGE: 53/05/1966 53 y.o.  Admit date: 03/11/2018 Discharge date: 03/16/2018  Admission Diagnoses:  Discharge Diagnoses:  Active Problems:   Appendicitis, acute   Discharged Condition: good  Hospital Course: 52 [facility].o. female [facility]. Workup was found to be significant for CT imaging demonstrating perforated appendicitis without abscess. Informed consent was obtained and documented, and patient underwent laparoscopic appendectomy Michela Pitcher([place], 03/11/2018).  Post-operatively, patient's pain improved/resolved and advancement of patient's diet and ambulation were well-tolerated. Due to perforation at the base of patient's appendix just above cecal staple line, patient was observed an additional day with IV antibiotics ongoing, during which patient progressed well. The remainder of patient's hospital course was essentially unremarkable, and discharge planning was initiated accordingly with patient safely able to be discharged home with appropriate discharge instructions, a course of oral antibiotics, pain control, and outpatient surgical follow-up after all of her and family's questions were answered to their expressed satisfaction.  Consults: None  Significant Diagnostic Studies: radiology: CT scan: perforated appendicitis without abscess  Treatments: IV hydration, antibiotics: Zosyn and surgery: laparoscopic appendectomy  Discharge Exam: Blood pressure 119/60, pulse (!) 106, temperature 98.8 F (37.1 C), temperature source Oral, resp. rate 19, height 5\' 8"  (1.727 m), weight 220 lb (99.8 kg), SpO2 94 %. General appearance: alert, cooperative and no distress GI: abdomen soft and non-distended with mild peri-incisional tenderness to palpation without surrounding erythema or drainage  Disposition:    Allergies as of 03/13/2018   No Known Allergies     Medication List    TAKE these medications    acetaminophen 500 MG tablet Commonly known as:  TYLENOL Take 500-1,000 mg by mouth every 6 (six) hours as needed for mild pain or moderate pain.   amoxicillin-clavulanate 875-125 MG tablet Commonly known as:  AUGMENTIN Take 1 tablet by mouth 2 (two) times daily for 10 days.   HYDROcodone-acetaminophen 5-325 MG tablet Commonly known as:  NORCO/VICODIN Take 1-2 tablets by mouth every 6 (six) hours as needed for moderate pain.   VICKS NYQUIL COUGH PO Take 1-2 capsules by mouth at bedtime as needed (cough).      Follow-up Information    Ancil Linseyavis, Kawanda Drumheller Evan, MD. Go on 03/22/2018.   Specialty:  General Surgery Why:  Thursday at 2:15pm for hospital follow-up Contact information: [address] [place] [address] [phone]           Signed: Ancil LinseyJason Evan Nathalee Smarr 03/16/2018, 6:21 PM

## 2018-03-22 ENCOUNTER — Encounter: Payer: Self-pay | Admitting: Surgery

## 2018-08-02 ENCOUNTER — Encounter: Payer: Self-pay | Admitting: *Deleted

## 2019-05-15 IMAGING — CT CT ABD-PELV W/ CM
2 of 5 series · 16 of 46 positions shown, 18 images · IV contrast (APPLIED)
Comparison: None.

CLINICAL DATA: Right lower quadrant abdominal pain

EXAM:
CT ABDOMEN AND PELVIS WITH CONTRAST
TECHNIQUE: Multidetector CT imaging of the abdomen and pelvis was performed
using the standard protocol following bolus administration of
intravenous contrast.
CONTRAST:  100mL OMNIPAQUE IOHEXOL 300 MG/ML  SOLN

[Series 2: routine abd/pel with · axial · 0.85mm/px · z∈[-556,-131]mm · 13 of 97 slices shown, 15 images]
[im 6/97  soft-tissue]
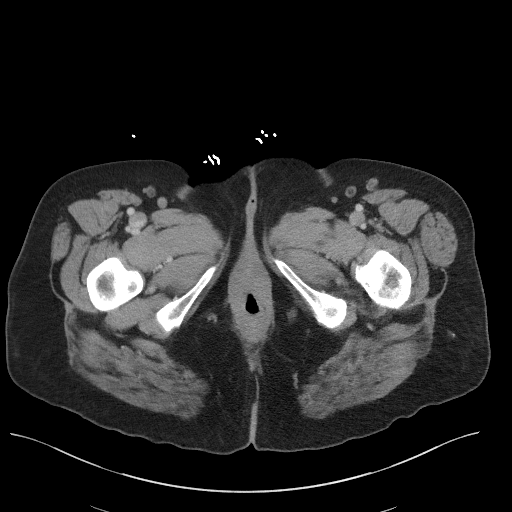
[im 6/97  bone]
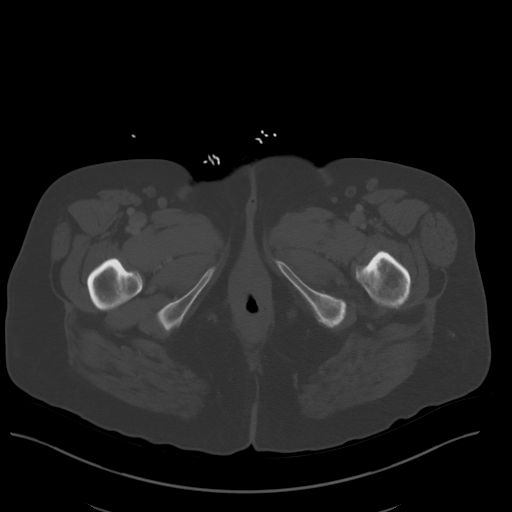
[im 16/97  soft-tissue]
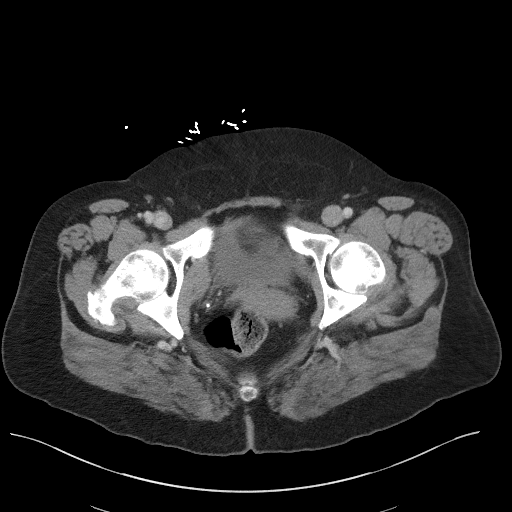
[im 21/97  soft-tissue]
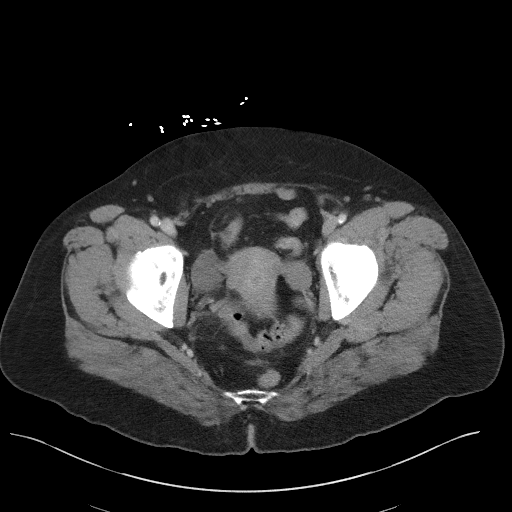
[im 26/97  soft-tissue]
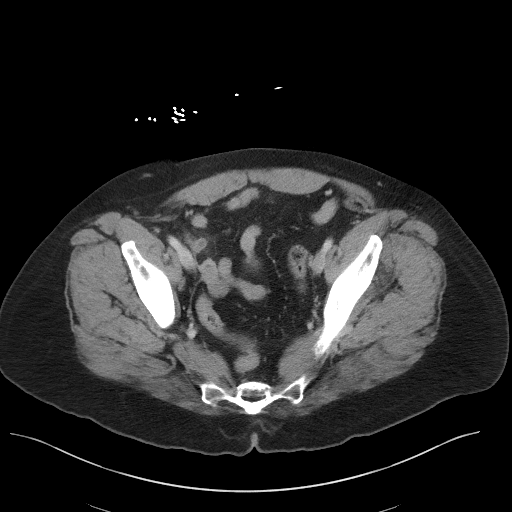
[im 36/97  soft-tissue]
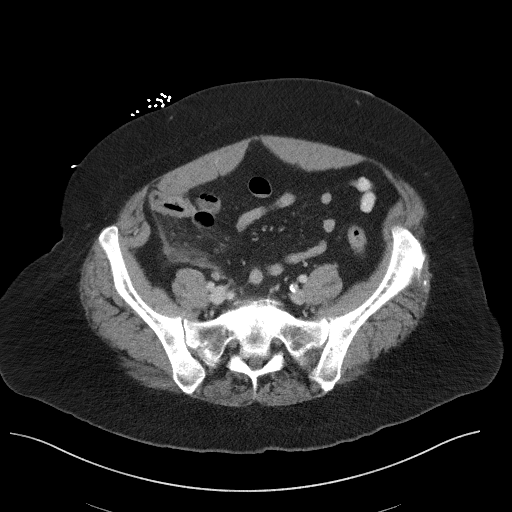
[im 41/97  soft-tissue]
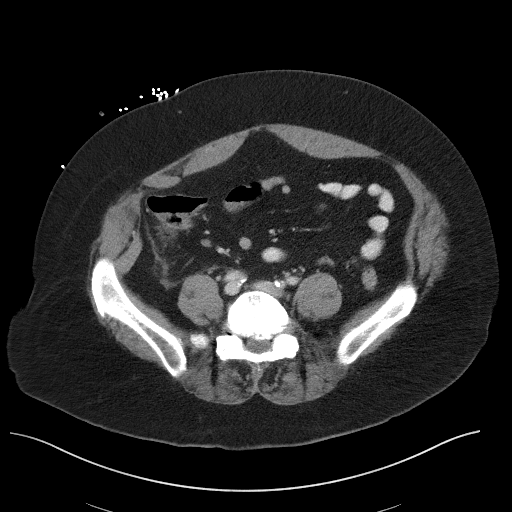
[im 51/97  soft-tissue]
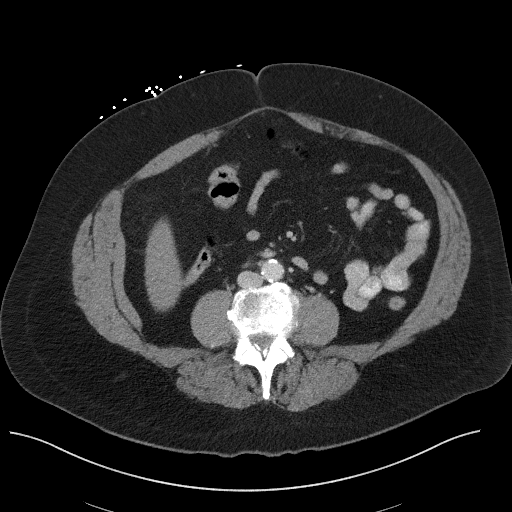
[im 56/97  soft-tissue]
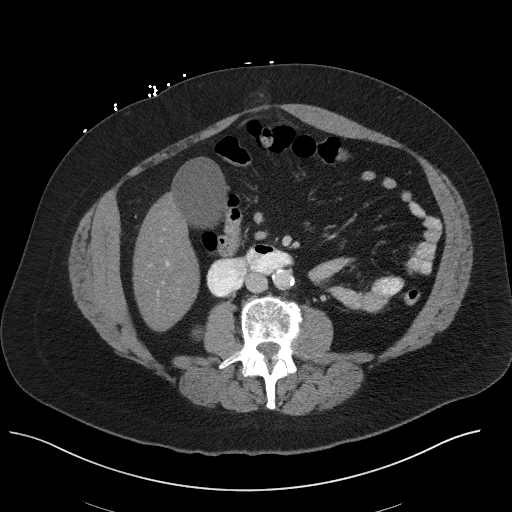
[im 61/97  soft-tissue]
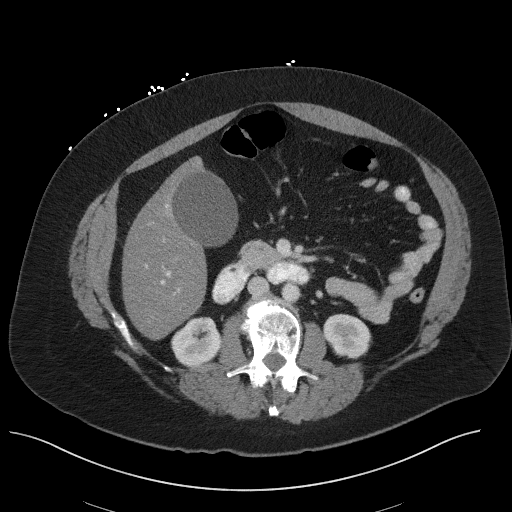
[im 61/97  bone]
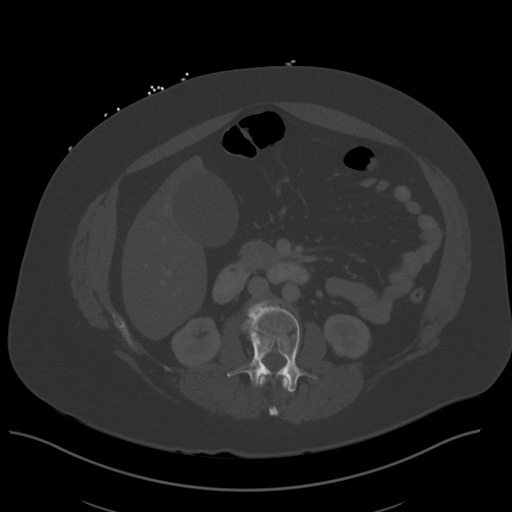
[im 71/97  soft-tissue]
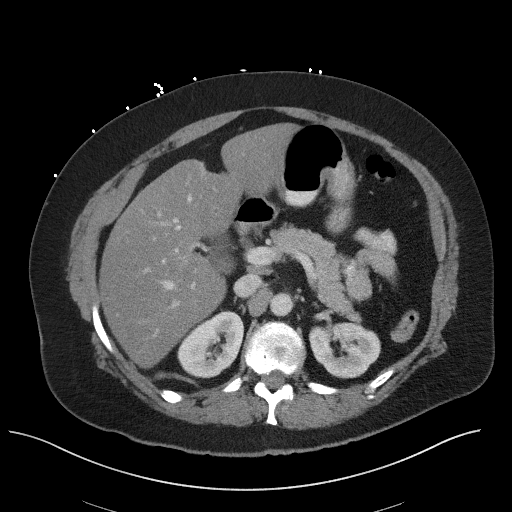
[im 76/97  soft-tissue]
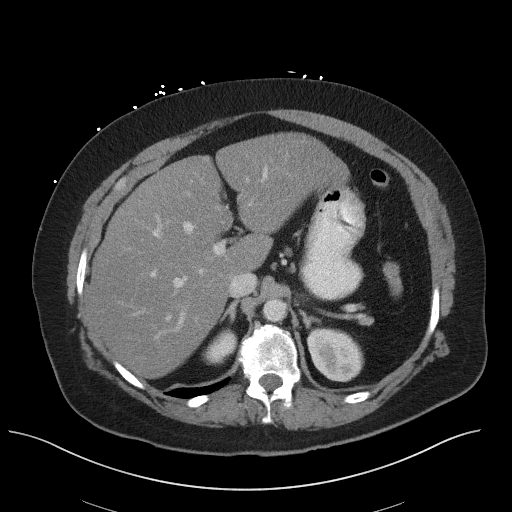
[im 81/97  soft-tissue]
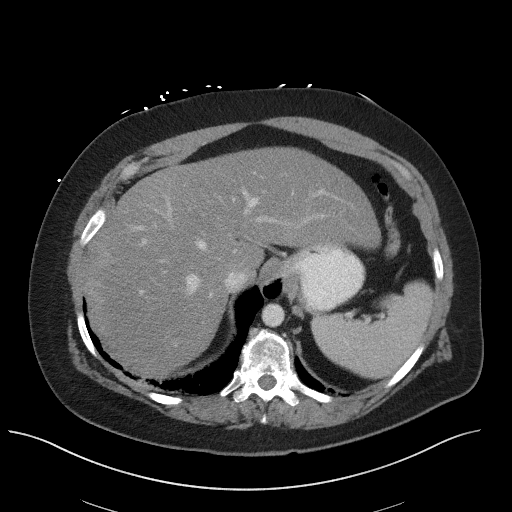
[im 91/97  soft-tissue]
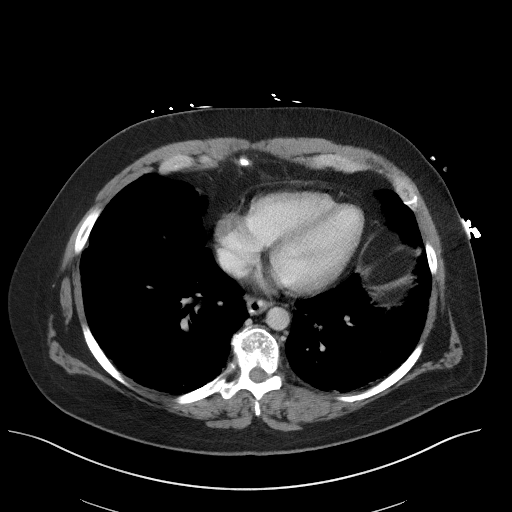

[Series 5: coronal st · coronal · 0.82mm/px · 3 of 106 slices shown]
[im 36/106  soft-tissue]
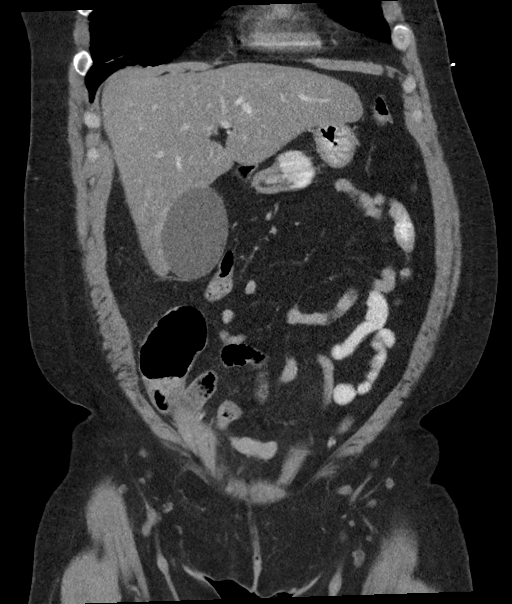
[im 47/106  soft-tissue]
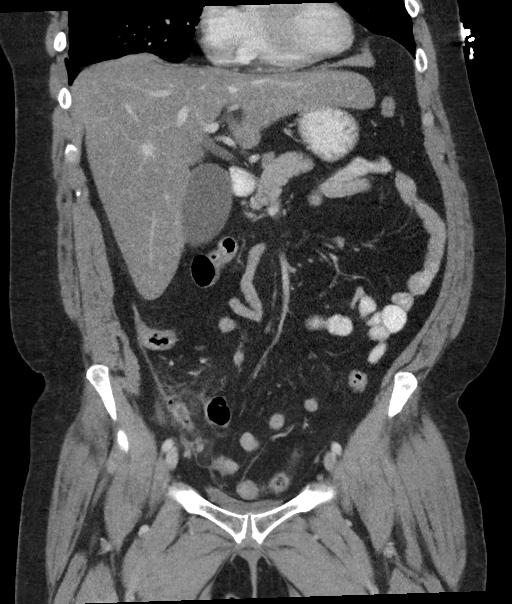
[im 59/106  soft-tissue]
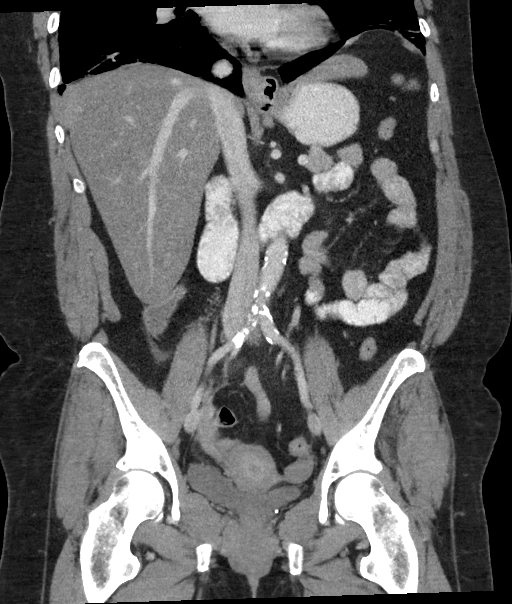

[16 of 46 positions shown; findings below may reference images not displayed]

FINDINGS: Lower chest: Lung bases demonstrate moderate emphysema and mild
subpleural fibrosis. No acute consolidation or effusion. Normal
heart size. Small hiatal hernia.

Hepatobiliary: Steatosis. Dilated gallbladder without calcified
stones. No biliary dilatation

Pancreas: Unremarkable. No pancreatic ductal dilatation or
surrounding inflammatory changes.

Spleen: Normal in size without focal abnormality.

Adrenals/Urinary Tract: Adrenal glands are within normal limits. No
hydronephrosis. Focal scarring in the mid pole of the left kidney.
Bladder normal

Stomach/Bowel: Stomach is nonenlarged. No dilated small bowel. Mild
wall thickening of the cecum. Abnormal appearance of the appendix.
Appendix enlarged at 11 mm. Moderate right lower quadrant
inflammatory changes. Small focus of suspected extraluminal gas
adjacent to the appendix, series 2 image number 65. Probable small
stone at the origin of the appendix.

Vascular/Lymphatic: Moderate aortic atherosclerosis. No aneurysmal
dilatation. No significantly enlarged lymph nodes.

Reproductive: Uterus and bilateral adnexa are unremarkable.

Other: Trace free fluid in the pelvis. Small fat in the umbilical
region

Musculoskeletal: Diffuse degenerative changes of the spine.
IMPRESSION: 1. Findings consistent with acute appendicitis. A small extraluminal
gas collection is suspected adjacent to the appendix, concerning for
a contained perforation. Mild wall thickening of adjacent cecum with
moderate right lower quadrant inflammatory changes.

Appendix: Location: Right lower quadrant

Diameter: 11 mm

Appendicolith: Probable small appendicolith at the origin of the
appendix

Mucosal hyper-enhancement: Present

Extraluminal gas: Small focal extraluminal gas collection adjacent
to the appendix

Periappendiceal collection: Moderate edema in the periappendiceal
fat but no rim enhancing organized fluid collection adjacent to the
appendix.

2.  Hepatic steatosis

3.   Dilated gallbladder

## 2021-07-02 ENCOUNTER — Other Ambulatory Visit (HOSPITAL_COMMUNITY): Payer: Self-pay | Admitting: Orthopedic Surgery

## 2021-07-02 ENCOUNTER — Other Ambulatory Visit: Payer: Self-pay | Admitting: Orthopedic Surgery

## 2021-07-02 DIAGNOSIS — M5416 Radiculopathy, lumbar region: Secondary | ICD-10-CM

## 2021-07-10 ENCOUNTER — Ambulatory Visit: Admission: RE | Admit: 2021-07-10 | Payer: 59 | Source: Ambulatory Visit

## 2021-07-18 ENCOUNTER — Ambulatory Visit: Payer: 59

## 2022-02-13 ENCOUNTER — Inpatient Hospital Stay: Payer: Self-pay

## 2022-02-13 ENCOUNTER — Encounter: Payer: Self-pay | Admitting: Internal Medicine

## 2022-02-13 ENCOUNTER — Inpatient Hospital Stay
Admission: EM | Admit: 2022-02-13 | Discharge: 2022-02-15 | DRG: 504 | Disposition: A | Discharge status: Home / Self Care | Payer: Self-pay | Attending: Internal Medicine | Admitting: Internal Medicine

## 2022-02-13 ENCOUNTER — Emergency Department: Payer: Self-pay

## 2022-02-13 ENCOUNTER — Other Ambulatory Visit: Payer: Self-pay

## 2022-02-13 DIAGNOSIS — F17210 Nicotine dependence, cigarettes, uncomplicated: Secondary | ICD-10-CM | POA: Diagnosis present

## 2022-02-13 DIAGNOSIS — S92911A Unspecified fracture of right toe(s), initial encounter for closed fracture: Secondary | ICD-10-CM

## 2022-02-13 DIAGNOSIS — Z79899 Other long term (current) drug therapy: Secondary | ICD-10-CM

## 2022-02-13 DIAGNOSIS — Z825 Family history of asthma and other chronic lower respiratory diseases: Secondary | ICD-10-CM

## 2022-02-13 DIAGNOSIS — X58XXXA Exposure to other specified factors, initial encounter: Secondary | ICD-10-CM | POA: Diagnosis present

## 2022-02-13 DIAGNOSIS — L97519 Non-pressure chronic ulcer of other part of right foot with unspecified severity: Secondary | ICD-10-CM

## 2022-02-13 DIAGNOSIS — L03115 Cellulitis of right lower limb: Principal | ICD-10-CM

## 2022-02-13 DIAGNOSIS — D539 Nutritional anemia, unspecified: Secondary | ICD-10-CM

## 2022-02-13 DIAGNOSIS — E1169 Type 2 diabetes mellitus with other specified complication: Principal | ICD-10-CM | POA: Diagnosis present

## 2022-02-13 DIAGNOSIS — F17200 Nicotine dependence, unspecified, uncomplicated: Secondary | ICD-10-CM

## 2022-02-13 DIAGNOSIS — M86171 Other acute osteomyelitis, right ankle and foot: Secondary | ICD-10-CM | POA: Diagnosis present

## 2022-02-13 DIAGNOSIS — Z20822 Contact with and (suspected) exposure to covid-19: Secondary | ICD-10-CM | POA: Diagnosis present

## 2022-02-13 DIAGNOSIS — E11621 Type 2 diabetes mellitus with foot ulcer: Secondary | ICD-10-CM | POA: Diagnosis present

## 2022-02-13 DIAGNOSIS — G609 Hereditary and idiopathic neuropathy, unspecified: Secondary | ICD-10-CM | POA: Diagnosis present

## 2022-02-13 DIAGNOSIS — S92401A Displaced unspecified fracture of right great toe, initial encounter for closed fracture: Secondary | ICD-10-CM | POA: Diagnosis present

## 2022-02-13 DIAGNOSIS — M869 Osteomyelitis, unspecified: Principal | ICD-10-CM | POA: Diagnosis present

## 2022-02-13 DIAGNOSIS — S92421A Displaced fracture of distal phalanx of right great toe, initial encounter for closed fracture: Secondary | ICD-10-CM | POA: Diagnosis present

## 2022-02-13 DIAGNOSIS — J439 Emphysema, unspecified: Secondary | ICD-10-CM

## 2022-02-13 LAB — RESP PANEL BY RT-PCR (FLU A&B, COVID) ARPGX2
Influenza A by PCR: NEGATIVE
Influenza B by PCR: NEGATIVE
SARS Coronavirus 2 by RT PCR: NEGATIVE

## 2022-02-13 LAB — BASIC METABOLIC PANEL
Anion gap: 9 (ref 5–15)
BUN: 12 mg/dL (ref 6–20)
CO2: 27 mmol/L (ref 22–32)
Calcium: 9.1 mg/dL (ref 8.9–10.3)
Chloride: 99 mmol/L (ref 98–111)
Creatinine, Ser: 0.75 mg/dL (ref 0.44–1.00)
GFR, Estimated: >60 mL/min (ref >60)
Glucose, Bld: 100 mg/dL — ABNORMAL HIGH (ref 70–99)
Potassium: 4.3 mmol/L (ref 3.5–5.1)
Sodium: 135 mmol/L (ref 135–145)

## 2022-02-13 LAB — CBC
HCT: 40.8 % (ref 36.0–46.0)
Hemoglobin: 12.8 g/dL (ref 12.0–15.0)
MCH: 32.3 pg (ref 26.0–34.0)
MCHC: 31.4 g/dL (ref 30.0–36.0)
MCV: 103 fL — ABNORMAL HIGH (ref 80.0–100.0)
Platelets: 285 10*3/uL (ref 150–400)
RBC: 3.96 MIL/uL (ref 3.87–5.11)
RDW: 14 % (ref 11.5–15.5)
WBC: 8.5 10*3/uL (ref 4.0–10.5)
nRBC: 0 % (ref 0.0–0.2)

## 2022-02-13 LAB — LACTIC ACID, PLASMA
Lactic Acid, Venous: 2.6 mmol/L (ref 0.5–1.9)
Lactic Acid, Venous: 1.6 mmol/L (ref 0.5–1.9)

## 2022-02-13 LAB — SURGICAL PCR SCREEN
MRSA, PCR: POSITIVE — AB
Staphylococcus aureus: POSITIVE — AB

## 2022-02-13 MED ORDER — SODIUM CHLORIDE 0.9 % IV SOLN
2.0000 g | Freq: Once | INTRAVENOUS | Status: AC
  Administered 2022-02-13: 2 g via INTRAVENOUS
  Filled 2022-02-13: qty 2

## 2022-02-13 MED ORDER — SODIUM CHLORIDE 0.9 % IV SOLN
2.0000 g | Freq: Three times a day (TID) | INTRAVENOUS | Status: DC
  Administered 2022-02-13 – 2022-02-15 (×5): 2 g via INTRAVENOUS
  Filled 2022-02-13 (×6): qty 2

## 2022-02-13 MED ORDER — LACTATED RINGERS IV SOLN: INTRAVENOUS | Status: DC

## 2022-02-13 MED ORDER — ALBUTEROL SULFATE (2.5 MG/3ML) 0.083% IN NEBU: 2.5000 mg | INHALATION_SOLUTION | RESPIRATORY_TRACT | Status: DC | PRN

## 2022-02-13 MED ORDER — ALBUTEROL SULFATE (2.5 MG/3ML) 0.083% IN NEBU
3.0000 mL | INHALATION_SOLUTION | Freq: Four times a day (QID) | RESPIRATORY_TRACT | Status: DC
  Administered 2022-02-13: 3 mL via RESPIRATORY_TRACT
  Filled 2022-02-13 (×2): qty 3

## 2022-02-13 MED ORDER — METRONIDAZOLE 500 MG/100ML IV SOLN
500.0000 mg | Freq: Once | INTRAVENOUS | Status: AC
  Administered 2022-02-13: 500 mg via INTRAVENOUS
  Filled 2022-02-13: qty 100

## 2022-02-13 MED ORDER — VANCOMYCIN HCL IN DEXTROSE 1-5 GM/200ML-% IV SOLN: 1000.0000 mg | Freq: Two times a day (BID) | INTRAVENOUS | Status: DC

## 2022-02-13 MED ORDER — VANCOMYCIN HCL IN DEXTROSE 1-5 GM/200ML-% IV SOLN: 1000.0000 mg | Freq: Once | INTRAVENOUS | Status: DC

## 2022-02-13 MED ORDER — OXYCODONE-ACETAMINOPHEN 5-325 MG PO TABS
1.0000 | ORAL_TABLET | Freq: Once | ORAL | Status: AC
  Administered 2022-02-13: 1 via ORAL
  Filled 2022-02-13: qty 1

## 2022-02-13 MED ORDER — CHLORHEXIDINE GLUCONATE CLOTH 2 % EX PADS
6.0000 | MEDICATED_PAD | Freq: Once | CUTANEOUS | Status: AC
  Administered 2022-02-14: 6 via TOPICAL

## 2022-02-13 MED ORDER — SODIUM CHLORIDE 0.9 % IV SOLN: 1.0000 g | Freq: Two times a day (BID) | INTRAVENOUS | Status: DC

## 2022-02-13 MED ORDER — GUAIFENESIN ER 600 MG PO TB12: 600.0000 mg | ORAL_TABLET | Freq: Two times a day (BID) | ORAL | Status: DC | PRN

## 2022-02-13 MED ORDER — POLYETHYLENE GLYCOL 3350 17 G PO PACK: 17.0000 g | PACK | Freq: Every day | ORAL | Status: DC | PRN

## 2022-02-13 MED ORDER — NICOTINE 21 MG/24HR TD PT24
21.0000 mg | MEDICATED_PATCH | Freq: Once | TRANSDERMAL | Status: AC
  Administered 2022-02-13: 21 mg via TRANSDERMAL
  Filled 2022-02-13: qty 1

## 2022-02-13 MED ORDER — ENOXAPARIN SODIUM 60 MG/0.6ML IJ SOSY
0.5000 mg/kg | PREFILLED_SYRINGE | INTRAMUSCULAR | Status: DC
  Administered 2022-02-14: 45 mg via SUBCUTANEOUS
  Filled 2022-02-13: qty 0.6

## 2022-02-13 MED ORDER — VANCOMYCIN HCL 1500 MG/300ML IV SOLN
1500.0000 mg | Freq: Once | INTRAVENOUS | Status: AC
  Administered 2022-02-13: 1500 mg via INTRAVENOUS
  Filled 2022-02-13: qty 300

## 2022-02-13 MED ORDER — ACETAMINOPHEN 650 MG RE SUPP: 650.0000 mg | Freq: Four times a day (QID) | RECTAL | Status: DC | PRN

## 2022-02-13 MED ORDER — ACETAMINOPHEN 325 MG PO TABS
650.0000 mg | ORAL_TABLET | Freq: Four times a day (QID) | ORAL | Status: DC | PRN
  Administered 2022-02-13: 650 mg via ORAL
  Filled 2022-02-13: qty 2

## 2022-02-13 MED ORDER — OXYCODONE HCL 5 MG PO TABS
5.0000 mg | ORAL_TABLET | ORAL | Status: DC | PRN
  Administered 2022-02-13 – 2022-02-15 (×6): 5 mg via ORAL
  Filled 2022-02-13 (×6): qty 1

## 2022-02-13 MED ORDER — VANCOMYCIN HCL 750 MG/150ML IV SOLN
750.0000 mg | Freq: Two times a day (BID) | INTRAVENOUS | Status: DC
  Administered 2022-02-14 (×3): 750 mg via INTRAVENOUS
  Filled 2022-02-13 (×5): qty 150

## 2022-02-13 MED ORDER — GABAPENTIN 300 MG PO CAPS
300.0000 mg | ORAL_CAPSULE | Freq: Three times a day (TID) | ORAL | Status: DC | PRN
  Administered 2022-02-13 – 2022-02-15 (×3): 300 mg via ORAL
  Filled 2022-02-13 (×4): qty 1

## 2022-02-13 MED ORDER — NICOTINE 21 MG/24HR TD PT24
21.0000 mg | MEDICATED_PATCH | Freq: Every day | TRANSDERMAL | Status: DC
  Administered 2022-02-14 – 2022-02-15 (×2): 21 mg via TRANSDERMAL
  Filled 2022-02-13 (×2): qty 1

## 2022-02-13 NOTE — H&P (Addendum)
?History and Physical  ? ? ?Patient: [name] [id number] DOB: 1966-10-10 ?DOA: 02/13/2022 ?DOS: the patient was seen and examined on 02/13/2022 ?PCP: Patient, No Pcp Per (Inactive)  ?Patient coming from: Home ? ?Chief Complaint:  ?Chief Complaint  ?Patient presents with  ? Wound Infection  ? ?HPI: [name] is a 56 y.o. female with medical history significant of COPD/emphysema with ongoing tobacco use was in her usual state of health until over 1 year ago when she developed an ulcer on the plantar aspect of her first right toe.  Patient states that she had a thick callus and notes "instead of using sandpaper, I went at it with a box cutter".  Patient notes that it opened up over a year ago but she has treated conservatively and "sometimes it looks like it is going to close up but it does not".  Patient states that over the past 3 days it has gotten progressively painful with erythema on her foot and the ulcer looks bigger.  Patient denies recent injury.  Patient does not think she stepped on anything or has a embedded foreign body.  Patient denies fevers or chills.  Patient states that she continues to smoke and knows this is bad for her. ? ?Patient denies previous history of MI, stroke, PVD, CAD.  Notes that she has had intermittent "lung infections" which needed antibiotics but has not been admitted to the hospital recently. ? ?  ?Review of Systems: As mentioned in the history of present illness. All other systems reviewed and are negative. ?Past Medical History:  ?Diagnosis Date  ? Emphysema, unspecified ([place])   ? ?Past Surgical History:  ?Procedure Laterality Date  ? LAPAROSCOPIC APPENDECTOMY N/A 03/11/2018  ? Procedure: APPENDECTOMY LAPAROSCOPIC;  Surgeon: Jeanie Cooks, MD;  Location: [facility] ORS;  Service: General;  Laterality: N/A;  ? ?Social History:  reports that she has been smoking cigarettes. She has been smoking an average of 1 pack per day. She has never used smokeless tobacco. She reports  that she does not drink alcohol and does not use drugs. ? ?No Known Allergies ? ?No family history on file. ? ?Prior to Admission medications   ?Medication Sig Start Date End Date Taking? Authorizing Provider  ?acetaminophen (TYLENOL) 500 MG tablet Take 500-1,000 mg by mouth every 6 (six) hours as needed for mild pain or moderate pain.    [provider]  ?Doxylamine-DM (VICKS NYQUIL COUGH PO) Take 1-2 capsules by mouth at bedtime as needed (cough).    [provider]  ?HYDROcodone-acetaminophen (NORCO/VICODIN) 5-325 MG tablet Take 1-2 tablets by mouth every 6 (six) hours as needed for moderate pain. 03/13/18   Vickie Epley, MD  ? ? ?Physical Exam: ?Vitals:  ? 02/13/22 1208 02/13/22 1214  ?BP:  (!) 155/94  ?Pulse:  83  ?Resp:  16  ?Temp:  98.4 ?F (36.9 ?C)  ?TempSrc:  Oral  ?SpO2:  94%  ?Weight: 90.7 kg   ?Height: 5\' 8"  (1.727 m)   ? ?Physical Exam: ?Blood pressure (!) 155/94, pulse 83, temperature 98.4 ?F (36.9 ?C), temperature source Oral, resp. rate 16, height 5\' 8"  (1.727 m), weight 90.7 kg, SpO2 94 %. ?Gen: Obese patient smelling strongly of smoke sitting up in bed eating hamburger and [place] fries in NAD. ?Eyes: sclera anicteric, conjuctiva mildly injected bilaterally ?CVS: S1-S2, regulary, no gallops ?Respiratory:  decreased air entry bilaterally with rare low pitched wheeze. ?GI: NABS, soft, NT  ?LE: Right toe with thick callus with punctate hole that  can likely be probe to bone on plantar aspect of first toe with some erythema and some warmth over dorsal aspect of foot heading towards the ankle. ?Neuro: A/O x 3, Moving all extremities equally with normal strength, CN 3-12 intact, grossly nonfocal.  ?Psych: patient is logical and coherent, judgement and insight appear normal, mood and affect appropriate to situation. ?Skin: no rashes or lesions or ulcers,  ? ?Data Reviewed: ? ?Right great toe film shows 2 bony fragments with irregular and ill-defined margins, "osteomyelitis would be  difficult to exclude".  Also notes that the findings could all be secondary to an acute fracture. ? ?Assessment and Plan: ?No notes have been filed under this hospital service. ?Service: Hospitalist ? ?Cellulitis of right forefoot with possible osteomyelitis ?Patient is afebrile with no leukocytosis however it does appear that the ulcer can be probed to bone and there is new cellulitis so I will start empiric antibiotics for possible osteomyelitis with vancomycin and cefepime.  Will request podiatry consultation for possible bone biopsy and further management--secure text sent to Dr. Blenda Mounts ? ?Fracture of right first toe ?Podiatry consult requested--Dr Blenda Mounts ? ?Ongoing tobacco use ?Patient is well aware that she is to stop smoking.  Notes that her father continues to smoke despite COPD and being oxygen dependent.  Notes she is thinking about it. ?NicoDerm patch ordered. ? ?COPD ?No evidence for acute flare, continue as needed albuterol ? ? ? ? Advance Care Planning:   Code Status: Not on file full ? ?Consults: Podiatry Dr Glenice Bow ? ?Family Communication: Patient had family with her at bedside throughout history and physical. ? ?Severity of Illness: ?The appropriate patient status for this patient is INPATIENT. Inpatient status is judged to be reasonable and necessary in order to provide the required intensity of service to ensure the patient's safety. The patient's presenting symptoms, physical exam findings, and initial radiographic and laboratory data in the context of their chronic comorbidities is felt to place them at high risk for further clinical deterioration. Furthermore, it is not anticipated that the patient will be medically stable for discharge from the hospital within 2 midnights of admission.  ? ?* I certify that at the point of admission it is my clinical judgment that the patient will require inpatient hospital care spanning beyond 2 midnights from the point of admission due to high intensity of  service, high risk for further deterioration and high frequency of surveillance required.* ? ?Author: ?Vashti Hey, MD ?02/13/2022 1:44 PM ? ?For on call review [email or web address].  ?

## 2022-02-13 NOTE — Progress Notes (Signed)
PHARMACY -  BRIEF ANTIBIOTIC NOTE  ? ?Pharmacy has received consult(s) for vancomycin and cefepime from an ED provider.  The patient's profile has been reviewed for ht/wt/allergies/indication/available labs.   ? ?One time order(s) placed for  ? ?1) cefepime 2 grams IV x 1 ? ?2) vancomycin 1500 mg IV x 1 ? ?Further antibiotics/pharmacy consults should be ordered by admitting physician if indicated.       ?                ?Thank you, ?Lowella Bandy ?02/13/2022  1:37 PM ? ? ?  ? ? ?

## 2022-02-13 NOTE — ED Provider Notes (Signed)
? ?[facility] ?Provider Note ? ? ? Event Date/Time  ? First MD Initiated Contact with Patient 02/13/22 1221   ?  (approximate) ? ? ?History  ? ?Wound Infection ? ? ?HPI ? ?[name] is a 56 y.o. female who presents to the ER for evaluation of right toe pain and wound.  States that she has a history of neuropathy.  She does smoke.  Denies any history of diabetes.  Has not been on any antibiotics.  Feels like right great toe is becoming increasingly painful having foul-smelling drainage and wound to the base of the toe.'s been progressively worsening over the past few months but became acutely swollen red and painful over the past 2 to 3 days. ?  ? ? ?Physical Exam  ? ?Triage Vital Signs: ?ED Triage Vitals  ?Enc Vitals Group  ?   BP 02/13/22 1214 (!) 155/94  ?   Pulse Rate 02/13/22 1214 83  ?   Resp 02/13/22 1214 16  ?   Temp 02/13/22 1214 98.4 ?F (36.9 ?C)  ?   Temp Source 02/13/22 1214 Oral  ?   SpO2 02/13/22 1214 94 %  ?   Weight 02/13/22 1208 200 lb (90.7 kg)  ?   Height 02/13/22 1208 5\' 8"  (1.727 m)  ?   Head Circumference --   ?   Peak Flow --   ?   Pain Score 02/13/22 1208 10  ?   Pain Loc --   ?   Pain Edu? --   ?   Excl. in GC? --   ? ? ?Most recent vital signs: ?Vitals:  ? 02/13/22 1214  ?BP: (!) 155/94  ?Pulse: 83  ?Resp: 16  ?Temp: 98.4 ?F (36.9 ?C)  ?SpO2: 94%  ? ? ? ?Constitutional: Alert  ?Eyes: Conjunctivae are normal.  ?Head: Atraumatic. ?Nose: No congestion/rhinnorhea. ?Mouth/Throat: Mucous membranes are moist.   ?Neck: Painless ROM.  ?Cardiovascular:   Good peripheral circulation. ?Respiratory: Normal respiratory effort.  No retractions.  ?Gastrointestinal: Soft and nontender.  ?Musculoskeletal: Great toe of the right foot is swollen erythematous with ulceration" few skin on the plantar aspect with tracking ulcer down to what I suspect is bone versus tendinous tissue.  There is more proximal mild erythema to the midfoot no crepitus.  No effusion.  Cap refill is less than  3 seconds but unable to palpate strong DP or PT pulse. ?Neurologic:  MAE spontaneously. No gross focal neurologic deficits are appreciated.  ?Skin:  Skin is warm, dry and intact. No rash noted. ?Psychiatric: Mood and affect are normal. Speech and behavior are normal. ? ? ? ?ED Results / Procedures / Treatments  ? ?Labs ?(all labs ordered are listed, but only abnormal results are displayed) ?Labs Reviewed  ?CBC - Abnormal; Notable for the following components:  ?    Result Value  ? MCV 103.0 (*)   ? All other components within normal limits  ?BASIC METABOLIC PANEL - Abnormal; Notable for the following components:  ? Glucose, Bld 100 (*)   ? All other components within normal limits  ?LACTIC ACID, PLASMA - Abnormal; Notable for the following components:  ? Lactic Acid, Venous 2.6 (*)   ? All other components within normal limits  ?RESP PANEL BY RT-PCR (FLU A&B, COVID) [id number]  ?CULTURE, BLOOD (ROUTINE X 2)  ?CULTURE, BLOOD (ROUTINE X 2)  ?LACTIC ACID, PLASMA  ? ? ? ?EKG ? ? ? ? ?RADIOLOGY ?Please see ED Course for my review and interpretation. ? ?  I personally reviewed all radiographic images ordered to evaluate for the above acute complaints and reviewed radiology reports and findings.  These findings were personally discussed with the patient.  Please see medical record for radiology report. ? ? ? ?PROCEDURES: ? ?Critical Care performed:  ? ?Procedures ? ? ?MEDICATIONS ORDERED IN ED: ?Medications  ?oxyCODONE-acetaminophen (PERCOCET/ROXICET) 5-325 MG per tablet 1 tablet (has no administration in time range)  ?nicotine (NICODERM CQ - dosed in mg/24 hours) patch 21 mg (has no administration in time range)  ?lactated ringers infusion (has no administration in time range)  ?ceFEPIme (MAXIPIME) 2 g in sodium chloride 0.9 % 100 mL IVPB (has no administration in time range)  ?metroNIDAZOLE (FLAGYL) IVPB 500 mg (has no administration in time range)  ?vancomycin (VANCOREADY) IVPB 1500 mg/300 mL (has no administration in time  range)  ? ? ? ?IMPRESSION / MDM / ASSESSMENT AND PLAN / ED COURSE  ?I reviewed the triage vital signs and the nursing notes. ?             ?               ? ?Differential diagnosis includes, but is not limited to, abscess, cellulitis, osteo-, fracture, gangrene, NSTI ? ?Patient presenting with quite a bit of cellulitic changes to the right toe foot with 4 peripheral circulation and ulceration on bottom of the toe concerning for deep space infection.  Blood work sent for the but differential ? ? ?Clinical Course as of 02/13/22 1344  ?Wynelle Link Feb 13, 2022  ?1333 My review and interpretation of the x-ray of her right foot and toe shows evidence of bony breakdown underlying the ulcer.  She denies any trauma this does appear consistent with osteomyelitis given the overlying cellulitic changes since her lactate is elevated and given the acute worsening of her symptoms and I have high suspicion for need for surgical debridement will discuss in consultation with hospitalist for admission. [PR]  ?1343 Case discussed in consultation with hospitalist who agreed admit patient to their service. [PR]  ?  ?Clinical Course User Index ?[PR] Willy Eddy, MD  ? ? ? ?FINAL CLINICAL IMPRESSION(S) / ED DIAGNOSES  ? ?Final diagnoses:  ?Cellulitis of right foot  ?Skin ulcer of right great toe, unspecified ulcer stage (HCC)  ? ? ? ?Rx / DC Orders  ? ?ED Discharge Orders   ? ? None  ? ?  ? ? ? ?Note:  This document was prepared using Dragon voice recognition software and may include unintentional dictation errors. ? ?  ?Willy Eddy, MD ?02/13/22 1345 ? ?

## 2022-02-13 NOTE — Consult Note (Signed)
?  Subjective:  ?Patient ID: [name], female    DOB: [birth year],  MRN: [id number] ? ?Patient with past medical history of emphysema/COPD seen at beside today for right hallux wound that has been present for about a years. Has been treating herself with neosporin and dressing changes. She is a 1 ppd smoker. Denies n/v/f/c. Marland Kitchen  ? ?Past Medical History:  ?Diagnosis Date  ? Emphysema, unspecified ([place])   ?  ? ?Past Surgical History:  ?Procedure Laterality Date  ? LAPAROSCOPIC APPENDECTOMY N/A 03/11/2018  ? Procedure: APPENDECTOMY LAPAROSCOPIC;  Surgeon: Jeanie Cooks, MD;  Location: [facility] ORS;  Service: General;  Laterality: N/A;  ? ? ?CBC Latest Ref Rng & Units 02/13/2022 03/13/2018 03/12/2018  ?WBC 4.0 - 10.5 K/uL 8.5 9.1 13.7(H)  ?Hemoglobin 12.0 - 15.0 g/dL 12.8 11.4(L) 11.9(L)  ?Hematocrit 36.0 - 46.0 % 40.8 34.3(L) 35.3  ?Platelets 150 - 400 K/uL 285 180 203  ? ? ?BMP Latest Ref Rng & Units 02/13/2022 03/12/2018 03/11/2018  ?Glucose 70 - 99 mg/dL 100(H) 166(H) 133(H)  ?BUN 6 - 20 mg/dL 12 18 17   ?Creatinine 0.44 - 1.00 mg/dL 0.75 1.00 0.88  ?Sodium 135 - 145 mmol/L 135 134(L) 133(L)  ?Potassium 3.5 - 5.1 mmol/L 4.3 4.7 4.3  ?Chloride 98 - 111 mmol/L 99 106 101  ?CO2 22 - 32 mmol/L 27 20(L) 21(L)  ?Calcium 8.9 - 10.3 mg/dL 9.1 7.7(L) 9.1  ? ? ? ?Objective:  ? ?Vitals:  ? 02/13/22 1330 02/13/22 1535  ?BP: (!) 121/91 120/66  ?Pulse:  88  ?Resp:  14  ?Temp:    ?SpO2:  100%  ? ? ?General:AA&O x 3. Normal mood and affect  ? ?Vascular: DP and PT pulses 2/4 bilateral. Brisk capillary refill to all digits. Pedal hair present  ? ?Neruological. Epicritic sensation grossly intact.  ? ?Derm: Plantar right hallux wound with granular base measuring 1 cm x 0.5cm x 0.5 cm .  Erythema and edema surrounding great toe. No purulence. Positive probe to bone. Interspaces clears of maceration. Nails well groomed and normal in appearance ? ?MSK: MMT 5/5 in dorsiflexion, plantar flexion, inversion and eversion. Normal joint ROM without pain or  crepitus.  ? ? ? ? ? ? ? ?Assessment & Plan:  ?Patient was evaluated and treated and all questions answered. ? ?DX: Right hallux ulcer with osteomyelitis  ?Wound care: betadine, DSD  ?Antibiotics: Per primary  ?DME: post-op shoe   ?Discussed with patient diagnosis and treatment options.  ?Imaging reviewed. Show fracture and erosions of the medial proximal distal phalanx concerning for osteomyelitis  ?Discussed with patient treatment options. Discussed ordering MRI for further evaluation but that she will likely need amputation of the right great toe.  ?Will also order ABIs.  ?Patient in agreement with plan and all questions answered.  ?Likely plan for right hallux amputation with Dr. Sherryle Lis tomorrow. Patient to be NPO after midnight.  ? ?Lorenda Peck, MD ? ?Accessible via secure chat for questions or concerns. ? ?

## 2022-02-13 NOTE — ED Notes (Signed)
1 set if blood cultures sent to lab ?

## 2022-02-13 NOTE — Progress Notes (Signed)
Pharmacy Antibiotic Note ? ?[name] is a 56 y.o. female admitted on 02/13/2022 for evaluation of right toe pain and wound. x-ray of her right foot and toe shows evidence of bony breakdown underlying the ulcer. Pharmacy has been consulted for vancomycin and cefepime dosing. Renal function is stable.  ? ?Plan: ? ?1) start cefepime 2 grams IV every 8 hours ? ?2) start vancomycin 1500 mg IV x 1 then 750 mg IV every 12 hours ?Goal AUC 400-550 ?Expected AUC: 476.9 ?SCr used: 0.80 mg/dL (rounded up) ?Ke: 0.069 h-1, T1/2 10.0 h ?Daily SCr to assess renal function while on IV vancomycin ? ?Height: 5\' 8"  (172.7 cm) ?Weight: 90.7 kg (200 lb) ?IBW/kg (Calculated) : 63.9 ? ?Temp (24hrs), Avg:98.4 ?F (36.9 ?C), Min:98.4 ?F (36.9 ?C), Max:98.4 ?F (36.9 ?C) ? ?Recent Labs  ?Lab 02/13/22 ?1213  ?WBC 8.5  ?CREATININE 0.75  ?LATICACIDVEN 2.6*  ?  ?Estimated Creatinine Clearance: 91.4 mL/min (by C-G formula based on SCr of 0.75 mg/dL).   ? ?No Known Allergies ? ?Antimicrobials this admission: ?03/19 vancomycin  >>  ?03/19 cefepime >>  ? ?Microbiology results: ?03/19 BCx: pending ? ? ?Thank you for allowing pharmacy to be a part of this patient?s care. ? ?Dallie Piles ?02/13/2022 2:17 PM ? ?

## 2022-02-13 NOTE — ED Triage Notes (Signed)
Pt via POV from home. Pt c/o possible infection in her R great toe states that she has had it over a year but now it started to become painful. Pt has a hx of peripheral neuropathy. Pt is A&OX4 and NAD ?

## 2022-02-13 NOTE — ED Notes (Signed)
Gray on ice; 1 set of blood cx's sent to lab. ?

## 2022-02-14 ENCOUNTER — Inpatient Hospital Stay: Payer: Self-pay | Admitting: Certified Registered Nurse Anesthetist

## 2022-02-14 ENCOUNTER — Inpatient Hospital Stay: Payer: Self-pay

## 2022-02-14 ENCOUNTER — Encounter
Admission: EM | Disposition: A | Discharge status: Home / Self Care | Payer: Self-pay | Source: Home / Self Care | Attending: Internal Medicine

## 2022-02-14 ENCOUNTER — Encounter: Payer: Self-pay | Admitting: Internal Medicine

## 2022-02-14 ENCOUNTER — Other Ambulatory Visit: Payer: Self-pay

## 2022-02-14 DIAGNOSIS — F17200 Nicotine dependence, unspecified, uncomplicated: Secondary | ICD-10-CM

## 2022-02-14 DIAGNOSIS — D539 Nutritional anemia, unspecified: Secondary | ICD-10-CM

## 2022-02-14 DIAGNOSIS — L03115 Cellulitis of right lower limb: Principal | ICD-10-CM

## 2022-02-14 DIAGNOSIS — M869 Osteomyelitis, unspecified: Secondary | ICD-10-CM

## 2022-02-14 DIAGNOSIS — L97519 Non-pressure chronic ulcer of other part of right foot with unspecified severity: Secondary | ICD-10-CM

## 2022-02-14 DIAGNOSIS — S92911A Unspecified fracture of right toe(s), initial encounter for closed fracture: Secondary | ICD-10-CM

## 2022-02-14 HISTORY — PX: AMPUTATION TOE: SHX6595

## 2022-02-14 LAB — CBC
HCT: 38.9 % (ref 36.0–46.0)
Hemoglobin: 12.2 g/dL (ref 12.0–15.0)
MCH: 32.4 pg (ref 26.0–34.0)
MCHC: 31.4 g/dL (ref 30.0–36.0)
MCV: 103.5 fL — ABNORMAL HIGH (ref 80.0–100.0)
Platelets: 269 10*3/uL (ref 150–400)
RBC: 3.76 MIL/uL — ABNORMAL LOW (ref 3.87–5.11)
RDW: 13.9 % (ref 11.5–15.5)
WBC: 7.1 10*3/uL (ref 4.0–10.5)
nRBC: 0 % (ref 0.0–0.2)

## 2022-02-14 LAB — BASIC METABOLIC PANEL
Anion gap: 9 (ref 5–15)
BUN: 12 mg/dL (ref 6–20)
CO2: 25 mmol/L (ref 22–32)
Calcium: 9 mg/dL (ref 8.9–10.3)
Chloride: 102 mmol/L (ref 98–111)
Creatinine, Ser: 0.71 mg/dL (ref 0.44–1.00)
GFR, Estimated: >60 mL/min (ref >60)
Glucose, Bld: 110 mg/dL — ABNORMAL HIGH (ref 70–99)
Potassium: 4.3 mmol/L (ref 3.5–5.1)
Sodium: 136 mmol/L (ref 135–145)

## 2022-02-14 LAB — APTT: aPTT: 38 seconds — ABNORMAL HIGH (ref 24–36)

## 2022-02-14 LAB — FOLATE: Folate: 8.4 ng/mL (ref >5.9)

## 2022-02-14 LAB — HIV ANTIBODY (ROUTINE TESTING W REFLEX): HIV Screen 4th Generation wRfx: NONREACTIVE

## 2022-02-14 LAB — GLUCOSE, CAPILLARY
Glucose-Capillary: 100 mg/dL — ABNORMAL HIGH (ref 70–99)
Glucose-Capillary: 86 mg/dL (ref 70–99)
Glucose-Capillary: 81 mg/dL (ref 70–99)

## 2022-02-14 LAB — PROTIME-INR
INR: 1.1 (ref 0.8–1.2)
Prothrombin Time: 13.8 seconds (ref 11.4–15.2)

## 2022-02-14 LAB — VITAMIN B12: Vitamin B-12: 229 pg/mL (ref 180–914)

## 2022-02-14 LAB — C-REACTIVE PROTEIN: CRP: 9.5 mg/dL — ABNORMAL HIGH (ref <1.0)

## 2022-02-14 LAB — SEDIMENTATION RATE: Sed Rate: 65 mm/hr — ABNORMAL HIGH (ref 0–30)

## 2022-02-14 SURGERY — AMPUTATION, TOE
Anesthesia: General | Site: Toe | Laterality: Right

## 2022-02-14 MED ORDER — FENTANYL CITRATE (PF) 100 MCG/2ML IJ SOLN
INTRAMUSCULAR | Status: AC
Start: 2022-02-14 — End: ?
  Filled 2022-02-14: qty 2

## 2022-02-14 MED ORDER — ACETAMINOPHEN 650 MG RE SUPP: 650.0000 mg | Freq: Four times a day (QID) | RECTAL | Status: DC | PRN

## 2022-02-14 MED ORDER — MIDAZOLAM HCL 2 MG/2ML IJ SOLN
INTRAMUSCULAR | Status: AC
  Filled 2022-02-14: qty 2

## 2022-02-14 MED ORDER — OXYCODONE HCL 5 MG/5ML PO SOLN: 5.0000 mg | Freq: Once | ORAL | Status: DC | PRN

## 2022-02-14 MED ORDER — LACTATED RINGERS IV SOLN: INTRAVENOUS | Status: DC

## 2022-02-14 MED ORDER — PROPOFOL 500 MG/50ML IV EMUL
INTRAVENOUS | Status: DC | PRN
Start: 2022-02-14 — End: 2022-02-14
  Administered 2022-02-14: 100 ug/kg/min via INTRAVENOUS

## 2022-02-14 MED ORDER — LIDOCAINE HCL (CARDIAC) PF 100 MG/5ML IV SOSY
PREFILLED_SYRINGE | INTRAVENOUS | Status: DC | PRN
  Administered 2022-02-14: 4 mL via INTRAVENOUS

## 2022-02-14 MED ORDER — PROPOFOL 10 MG/ML IV BOLUS
INTRAVENOUS | Status: AC
  Filled 2022-02-14: qty 20

## 2022-02-14 MED ORDER — NEOMYCIN-POLYMYXIN B GU 40-200000 IR SOLN
Status: DC | PRN
  Administered 2022-02-14: 2 mL

## 2022-02-14 MED ORDER — LIDOCAINE HCL (PF) 2 % IJ SOLN
INTRAMUSCULAR | Status: AC
  Filled 2022-02-14: qty 5

## 2022-02-14 MED ORDER — ACETAMINOPHEN 10 MG/ML IV SOLN: 1000.0000 mg | Freq: Once | INTRAVENOUS | Status: DC | PRN

## 2022-02-14 MED ORDER — GLYCOPYRROLATE 0.2 MG/ML IJ SOLN
INTRAMUSCULAR | Status: DC | PRN
  Administered 2022-02-14: .2 mg via INTRAVENOUS

## 2022-02-14 MED ORDER — MORPHINE SULFATE (PF) 2 MG/ML IV SOLN
1.0000 mg | Freq: Once | INTRAVENOUS | Status: AC
  Administered 2022-02-14: 1 mg via INTRAVENOUS
  Filled 2022-02-14: qty 1

## 2022-02-14 MED ORDER — 0.9 % SODIUM CHLORIDE (POUR BTL) OPTIME
TOPICAL | Status: DC | PRN
  Administered 2022-02-14: 500 mL

## 2022-02-14 MED ORDER — PHENYLEPHRINE HCL (PRESSORS) 10 MG/ML IV SOLN
INTRAVENOUS | Status: DC | PRN
  Administered 2022-02-14: 160 ug via INTRAVENOUS
  Administered 2022-02-14: 80 ug via INTRAVENOUS
  Administered 2022-02-14 (×3): 160 ug via INTRAVENOUS
  Administered 2022-02-14: 80 ug via INTRAVENOUS

## 2022-02-14 MED ORDER — MUPIROCIN 2 % EX OINT
1.0000 "application " | TOPICAL_OINTMENT | Freq: Two times a day (BID) | CUTANEOUS | Status: DC
  Administered 2022-02-14 (×2): 1 via NASAL
  Filled 2022-02-14: qty 22

## 2022-02-14 MED ORDER — PROPOFOL 10 MG/ML IV BOLUS
INTRAVENOUS | Status: DC | PRN
  Administered 2022-02-14 (×2): 20 mg via INTRAVENOUS

## 2022-02-14 MED ORDER — FENTANYL CITRATE (PF) 100 MCG/2ML IJ SOLN: 25.0000 ug | INTRAMUSCULAR | Status: DC | PRN

## 2022-02-14 MED ORDER — CHLORHEXIDINE GLUCONATE CLOTH 2 % EX PADS
6.0000 | MEDICATED_PAD | Freq: Every day | CUTANEOUS | Status: DC
  Administered 2022-02-14: 6 via TOPICAL

## 2022-02-14 MED ORDER — LACTATED RINGERS IV SOLN: INTRAVENOUS | Status: DC | PRN

## 2022-02-14 MED ORDER — BUPIVACAINE HCL 0.5 % IJ SOLN
INTRAMUSCULAR | Status: DC | PRN
  Administered 2022-02-14: 10 mL

## 2022-02-14 MED ORDER — ONDANSETRON HCL 4 MG/2ML IJ SOLN: 4.0000 mg | Freq: Once | INTRAMUSCULAR | Status: DC | PRN

## 2022-02-14 MED ORDER — ACETAMINOPHEN 325 MG PO TABS
650.0000 mg | ORAL_TABLET | ORAL | Status: DC | PRN
  Administered 2022-02-14 – 2022-02-15 (×2): 650 mg via ORAL
  Filled 2022-02-14 (×2): qty 2

## 2022-02-14 MED ORDER — ONDANSETRON HCL 4 MG/2ML IJ SOLN
INTRAMUSCULAR | Status: AC
Start: 2022-02-14 — End: ?
  Filled 2022-02-14: qty 2

## 2022-02-14 MED ORDER — OXYCODONE HCL 5 MG PO TABS: 5.0000 mg | ORAL_TABLET | Freq: Once | ORAL | Status: DC | PRN

## 2022-02-14 MED ORDER — MIDAZOLAM HCL 2 MG/2ML IJ SOLN
INTRAMUSCULAR | Status: DC | PRN
  Administered 2022-02-14: 2 mg via INTRAVENOUS

## 2022-02-14 SURGICAL SUPPLY — 54 items
BLADE MED AGGRESSIVE (BLADE) ×3 IMPLANT
BLADE OSC/SAGITTAL MD 5.5X18 (BLADE) IMPLANT
BLADE SURG 15 STRL LF DISP TIS (BLADE) IMPLANT
BLADE SURG 15 STRL SS (BLADE)
BLADE SURG MINI STRL (BLADE) IMPLANT
BNDG CMPR 75X41 PLY HI ABS (GAUZE/BANDAGES/DRESSINGS) ×1
BNDG CONFORM 2 STRL LF (GAUZE/BANDAGES/DRESSINGS) IMPLANT
BNDG ELASTIC 4X5.8 VLCR STR LF (GAUZE/BANDAGES/DRESSINGS) ×3 IMPLANT
BNDG ESMARK 4X12 TAN STRL LF (GAUZE/BANDAGES/DRESSINGS) ×3 IMPLANT
BNDG GAUZE ELAST 4 BULKY (GAUZE/BANDAGES/DRESSINGS) ×3 IMPLANT
BNDG STRETCH 4X75 STRL LF (GAUZE/BANDAGES/DRESSINGS) ×1 IMPLANT
CNTNR SPEC 2.5X3XGRAD LEK (MISCELLANEOUS) ×1
CONT SPEC 4OZ STER OR WHT (MISCELLANEOUS) ×1
CONT SPEC 4OZ STRL OR WHT (MISCELLANEOUS) ×1
CONTAINER SPEC 2.5X3XGRAD LEK (MISCELLANEOUS) ×2 IMPLANT
CUFF TOURN SGL QUICK 12 (TOURNIQUET CUFF) IMPLANT
CUFF TOURN SGL QUICK 18X4 (TOURNIQUET CUFF) IMPLANT
DRAPE FLUOR MINI C-ARM 54X84 (DRAPES) IMPLANT
DURAPREP 26ML APPLICATOR (WOUND CARE) ×3 IMPLANT
ELECT REM PT RETURN 9FT ADLT (ELECTROSURGICAL) ×2
ELECTRODE REM PT RTRN 9FT ADLT (ELECTROSURGICAL) ×2 IMPLANT
GAUZE SPONGE 4X4 12PLY STRL (GAUZE/BANDAGES/DRESSINGS) ×5 IMPLANT
GAUZE XEROFORM 1X8 LF (GAUZE/BANDAGES/DRESSINGS) ×3 IMPLANT
GLOVE SRG 8 PF TXTR STRL LF DI (GLOVE) ×2 IMPLANT
GLOVE SURG ENC MOIS LTX SZ8 (GLOVE) ×3 IMPLANT
GLOVE SURG UNDER POLY LF SZ8 (GLOVE) ×2
GOWN STRL REUS W/ TWL LRG LVL3 (GOWN DISPOSABLE) ×4 IMPLANT
GOWN STRL REUS W/TWL LRG LVL3 (GOWN DISPOSABLE) ×4
HANDPIECE VERSAJET DEBRIDEMENT (MISCELLANEOUS) IMPLANT
IV NS IRRIG 3000ML ARTHROMATIC (IV SOLUTION) IMPLANT
KIT TURNOVER KIT A (KITS) ×3 IMPLANT
LABEL OR SOLS (LABEL) ×3 IMPLANT
MANIFOLD NEPTUNE II (INSTRUMENTS) ×3 IMPLANT
NDL FILTER BLUNT 18X1 1/2 (NEEDLE) ×2 IMPLANT
NDL HYPO 25X1 1.5 SAFETY (NEEDLE) ×4 IMPLANT
NEEDLE FILTER BLUNT 18X 1/2SAF (NEEDLE) ×1
NEEDLE FILTER BLUNT 18X1 1/2 (NEEDLE) ×1 IMPLANT
NEEDLE HYPO 25X1 1.5 SAFETY (NEEDLE) ×4 IMPLANT
NS IRRIG 500ML POUR BTL (IV SOLUTION) ×3 IMPLANT
PACK EXTREMITY ARMC (MISCELLANEOUS) ×3 IMPLANT
SOL PREP PVP 2OZ (MISCELLANEOUS) ×2
SOLUTION PREP PVP 2OZ (MISCELLANEOUS) ×2 IMPLANT
STAPLER SKIN PROX 35W (STAPLE) ×1 IMPLANT
STOCKINETTE STRL 6IN 960660 (GAUZE/BANDAGES/DRESSINGS) ×3 IMPLANT
SUT ETHILON 4-0 (SUTURE) ×2
SUT ETHILON 4-0 FS2 18XMFL BLK (SUTURE) ×1
SUT MNCRL 3-0 UNDYED SH (SUTURE) IMPLANT
SUT MONOCRYL 3-0 UNDYED (SUTURE) ×2
SUT VIC AB 3-0 SH 27 (SUTURE) ×2
SUT VIC AB 3-0 SH 27X BRD (SUTURE) ×2 IMPLANT
SUTURE ETHLN 4-0 FS2 18XMF BLK (SUTURE) IMPLANT
SWAB CULTURE AMIES ANAERIB BLU (MISCELLANEOUS) IMPLANT
SYR 10ML LL (SYRINGE) ×3 IMPLANT
WATER STERILE IRR 500ML POUR (IV SOLUTION) ×3 IMPLANT

## 2022-02-14 NOTE — Progress Notes (Signed)
?Progress Note ? ? ? ?[name]   ?[id number]  ?DOB: 1966/08/26  ?DOA: 02/13/2022     1 ?PCP: Patient, No Pcp Per (Inactive) ? ?Initial CC: Right great toe wound ? ?Hospital Course: ?[name] is a 56 year old female with PMH emphysema, ongoing tobacco use who presented with an ongoing wound involving her right foot first digit.  She had cut a callus off with a box cutter and had difficulty with the wound healing ever since. ?MRI was obtained which was consistent with osteomyelitis involving the great toe.  She was evaluated by podiatry with recommendations for amputation.  Antibiotics were initiated on admission as well. ? ?Interval History:  ?Resting in bed when seen this morning.  Understands she cannot eat until after surgery today.  She is amenable for toe amputation if needed. ? ?Assessment and Plan: ?* Osteomyelitis of great toe of right foot ([place]) ?- MRI toes performed on admission with shows osteomyelitis involving distal phalanx of right great toe ?-ABIs obtained and are normal ?- Podiatry following appreciate assistance.  Tentative plan is for toe amputation on 02/14/2022. ?-Continue cefepime ?- Follow-up OR findings, see podiatry note ? ?Toe fracture, right ?- Displaced fracture involving base of distal phalanx of the right great toe ?-See podiatry note and follow-up OR findings ? ?Cellulitis of right foot ?- Continue cefepime ?- Follow-up OR findings ? ?Macrocytic anemia ?- Check folate and B12 ? ?COPD (chronic obstructive pulmonary disease) with emphysema ([place]) ?- Continue albuterol PRN ? ?Tobacco use disorder ?- Patient has been heavily counseled on smoking cessation ? ? ? ?Old records reviewed in assessment of this patient ? ?Antimicrobials: ?Flagyl 319 2023 x 1 ?Cefepime 02/13/2022 >> current ? ?DVT prophylaxis:  ?SCD's Start: 02/13/22 2148 ? ? ?Code Status:   Code Status: Full Code ? ?Disposition Plan:  Home possibly Tues ?Status is: Inpt ? ?Objective: ?Blood pressure (!) 141/85, pulse 73,  temperature 98.5 ?F (36.9 ?C), resp. rate 14, height '5\' 8"'  (1.727 m), weight 90.7 kg, SpO2 90 %.  ?Examination:  ?Physical Exam ?Constitutional:   ?   General: She is not in acute distress. ?   Appearance: Normal appearance.  ?HENT:  ?   Head: Normocephalic and atraumatic.  ?   Mouth/Throat:  ?   Mouth: Mucous membranes are moist.  ?Eyes:  ?   Extraocular Movements: Extraocular movements intact.  ?Cardiovascular:  ?   Rate and Rhythm: Normal rate and regular rhythm.  ?Pulmonary:  ?   Effort: Pulmonary effort is normal.  ?   Breath sounds: Normal breath sounds.  ?Abdominal:  ?   General: Bowel sounds are normal. There is no distension.  ?   Palpations: Abdomen is soft.  ?   Tenderness: There is no abdominal tenderness.  ?Musculoskeletal:  ?   Cervical back: Normal range of motion and neck supple.  ?   Comments: Purulent drainage appreciated from plantar aspect of right great toe and overall discoloration of toe.  Left great toe also has callus on the plantar aspect  ?Skin: ?   General: Skin is warm and dry.  ?Neurological:  ?   General: No focal deficit present.  ?   Mental Status: She is alert.  ?Psychiatric:     ?   Mood and Affect: Mood normal.     ?   Behavior: Behavior normal.  ?  ? ?Consultants:  ?Podiatry ? ?Procedures:  ? ? ?Data Reviewed: ?Results for orders placed or performed during the hospital encounter of 02/13/22 (from the  past 24 hour(s))  ?Lactic acid, plasma     Status: None  ? Collection Time: 02/13/22  2:37 PM  ?Result Value Ref Range  ? Lactic Acid, Venous 1.6 0.5 - 1.9 mmol/L  ?Blood Culture (routine x 2)     Status: None (Preliminary result)  ? Collection Time: 02/13/22  2:37 PM  ? Specimen: BLOOD  ?Result Value Ref Range  ? Specimen Description BLOOD RAC   ? Special Requests BOTTLES DRAWN AEROBIC AND ANAEROBIC BCLV   ? Culture    ?  NO GROWTH < 24 HOURS ?Performed at [facility], [address] [place], [place] [address] ?  ? Report Status PENDING   ?Resp Panel by RT-PCR (Flu A&B,  Covid) Nasopharyngeal Swab     Status: None  ? Collection Time: 02/13/22  3:43 PM  ? Specimen: Nasopharyngeal Swab; Nasopharyngeal(NP) swabs in vial transport medium  ?Result Value Ref Range  ? SARS Coronavirus 2 by RT PCR NEGATIVE NEGATIVE  ? Influenza A by PCR NEGATIVE NEGATIVE  ? Influenza B by PCR NEGATIVE NEGATIVE  ?Surgical pcr screen     Status: Abnormal  ? Collection Time: 02/13/22  9:52 PM  ? Specimen: Nasal Mucosa; Nasal Swab  ?Result Value Ref Range  ? MRSA, PCR POSITIVE (A) NEGATIVE  ? Staphylococcus aureus POSITIVE (A) NEGATIVE  ?HIV Antibody (routine testing w rflx)     Status: None  ? Collection Time: 02/14/22  4:50 AM  ?Result Value Ref Range  ? HIV Screen 4th Generation wRfx Non Reactive Non Reactive  ?Basic metabolic panel     Status: Abnormal  ? Collection Time: 02/14/22  4:50 AM  ?Result Value Ref Range  ? Sodium 136 135 - 145 mmol/L  ? Potassium 4.3 3.5 - 5.1 mmol/L  ? Chloride 102 98 - 111 mmol/L  ? CO2 25 22 - 32 mmol/L  ? Glucose, Bld 110 (H) 70 - 99 mg/dL  ? BUN 12 6 - 20 mg/dL  ? Creatinine, Ser 0.71 0.44 - 1.00 mg/dL  ? Calcium 9.0 8.9 - 10.3 mg/dL  ? GFR, Estimated >60 >60 mL/min  ? Anion gap 9 5 - 15  ?CBC     Status: Abnormal  ? Collection Time: 02/14/22  4:50 AM  ?Result Value Ref Range  ? WBC 7.1 4.0 - 10.5 K/uL  ? RBC 3.76 (L) 3.87 - 5.11 MIL/uL  ? Hemoglobin 12.2 12.0 - 15.0 g/dL  ? HCT 38.9 36.0 - 46.0 %  ? MCV 103.5 (H) 80.0 - 100.0 fL  ? MCH 32.4 26.0 - 34.0 pg  ? MCHC 31.4 30.0 - 36.0 g/dL  ? RDW 13.9 11.5 - 15.5 %  ? Platelets 269 150 - 400 K/uL  ? nRBC 0.0 0.0 - 0.2 %  ?Protime-INR     Status: None  ? Collection Time: 02/14/22  4:50 AM  ?Result Value Ref Range  ? Prothrombin Time 13.8 11.4 - 15.2 seconds  ? INR 1.1 0.8 - 1.2  ?APTT     Status: Abnormal  ? Collection Time: 02/14/22  4:50 AM  ?Result Value Ref Range  ? aPTT 38 (H) 24 - 36 seconds  ?ESR     Status: Abnormal  ? Collection Time: 02/14/22  4:50 AM  ?Result Value Ref Range  ? Sed Rate 65 (H) 0 - 30 mm/hr  ?C-reactive  protein     Status: Abnormal  ? Collection Time: 02/14/22  4:50 AM  ?Result Value Ref Range  ? CRP 9.5 (H) <1.0  mg/dL  ?Glucose, capillary     Status: Abnormal  ? Collection Time: 02/14/22  7:46 AM  ?Result Value Ref Range  ? Glucose-Capillary 100 (H) 70 - 99 mg/dL  ? Comment 1 Notify RN   ? Comment 2 Document in Chart   ?Glucose, capillary     Status: None  ? Collection Time: 02/14/22 11:36 AM  ?Result Value Ref Range  ? Glucose-Capillary 86 70 - 99 mg/dL  ?  ?I have Reviewed nursing notes, Vitals, and Lab results since pt's last encounter. Pertinent lab results : see above ?I have ordered test including BMP, CBC, Mg ?I have reviewed the last note from staff over past 24 hours ?I have discussed pt's care plan and test results with nursing staff, case manager ? ? LOS: 1 day  ? ?Dwyane Dee, MD ?[facility] ?02/14/2022, 1:59 PM ? ?

## 2022-02-14 NOTE — Assessment & Plan Note (Signed)
-   Patient has been heavily counseled on smoking cessation ?

## 2022-02-14 NOTE — Anesthesia Preprocedure Evaluation (Addendum)
Anesthesia Evaluation  ?Patient identified by MRN, date of birth, ID band ?Patient awake ? ? ? ?Reviewed: ?Allergy & Precautions, NPO status , Patient's Chart, lab work & pertinent test results ? ?History of Anesthesia Complications ?Negative for: history of anesthetic complications ? ?Airway ?Mallampati: I ? ? ?Neck ROM: Full ? ? ? Dental ? ?(+) Edentulous Upper, Edentulous Lower ?  ?Pulmonary ?COPD, Current Smoker (2 ppd) and Patient abstained from smoking.,  ?  ?Pulmonary exam normal ?breath sounds clear to auscultation ? ? ? ? ? ? Cardiovascular ?Exercise Tolerance: Good ?negative cardio ROS ?Normal cardiovascular exam ?Rhythm:Regular Rate:Normal ? ?ECG 02/13/22:  ?Sinus rhythm ?Ventricular premature complex ?Probable left atrial enlargement ?Left anterior fascicular block ?Low voltage, precordial leads ?Consider anterior infarct ?Nonspecific T abnormalities, lateral leads ?Baseline wander in lead(s) V1 V2 ?  ?Neuro/Psych ?negative neurological ROS ?   ? GI/Hepatic ?negative GI ROS,   ?Endo/Other  ?negative endocrine ROS ? Renal/GU ?negative Renal ROS  ? ?  ?Musculoskeletal ?Right great toe cellulitis  ? Abdominal ?  ?Peds ? Hematology ? ?(+) Blood dyscrasia, anemia ,   ?Anesthesia Other Findings ? ? Reproductive/Obstetrics ? ?  ? ? ? ? ? ? ? ? ? ? ? ? ? ?  ?  ? ? ? ? ? ? ? ?Anesthesia Physical ?Anesthesia Plan ? ?ASA: 2 ? ?Anesthesia Plan: General  ? ?Post-op Pain Management:   ? ?Induction: Intravenous ? ?PONV Risk Score and Plan: 2 and Propofol infusion, TIVA, Treatment may vary due to age or medical condition and Ondansetron ? ?Airway Management Planned: Natural Airway ? ?Additional Equipment:  ? ?Intra-op Plan:  ? ?Post-operative Plan:  ? ?Informed Consent: I have reviewed the patients History and Physical, chart, labs and discussed the procedure including the risks, benefits and alternatives for the proposed anesthesia with the patient or authorized representative who has  indicated his/her understanding and acceptance.  ? ? ? ? ? ?Plan Discussed with: CRNA ? ?Anesthesia Plan Comments: (LMA/GETA backup discussed.  Patient consented for risks of anesthesia including but not limited to:  ?- adverse reactions to medications ?- damage to eyes, teeth, lips or other oral mucosa ?- nerve damage due to positioning  ?- sore throat or hoarseness ?- damage to heart, brain, nerves, lungs, other parts of body or loss of life ? ?Informed patient about role of CRNA in peri- and intra-operative care.  Patient voiced understanding.)  ? ? ? ? ? ? ?Anesthesia Quick Evaluation ? ?

## 2022-02-14 NOTE — Anesthesia Postprocedure Evaluation (Signed)
Anesthesia Post Note ? ?Patient: [name] ? ?Procedure(s) Performed: AMPUTATION TOE (Right: Toe) ? ?Patient location during evaluation: PACU ?Anesthesia Type: General ?Level of consciousness: awake and alert, oriented and patient cooperative ?Pain management: pain level controlled ?Vital Signs Assessment: post-procedure vital signs reviewed and stable ?Respiratory status: spontaneous breathing, nonlabored ventilation and respiratory function stable ?Cardiovascular status: blood pressure returned to baseline and stable ?Postop Assessment: adequate PO intake ?Anesthetic complications: no ? ? ?No notable events documented. ? ? ?Last Vitals:  ?Vitals:  ? 02/14/22 1823 02/14/22 1842  ?BP: 125/67 133/85  ?Pulse: 81 80  ?Resp: 13 16  ?Temp: 36.5 ?C 36.6 ?C  ?SpO2: 94% 92%  ?  ?Last Pain:  ?Vitals:  ? 02/14/22 1823  ?TempSrc:   ?PainSc: 0-No pain  ? ? ?  ?  ?  ?  ?  ?  ? ?Reed Breech ? ? ? ? ?

## 2022-02-14 NOTE — Assessment & Plan Note (Addendum)
-   MRI toes performed on admission with shows osteomyelitis involving distal phalanx of right great toe ?-ABIs obtained and are normal ?- Podiatry following appreciate assistance.  Tentative plan is for toe amputation on 02/14/2022. ?-Continue cefepime ?- Follow-up OR findings, see podiatry note ?

## 2022-02-14 NOTE — Progress Notes (Signed)
?  Subjective:  ?Patient ID: [name], female    DOB: 1966/09/05,  MRN: [id number] ? ?Patient seen at bedside resting comfortably, she is frustrated she has not been able to eat today ? ?Negative for chest pain and shortness of breath ?Fever: no ?Night sweats: no ?Review of all other systems is negative ?Objective:  ? ?Vitals:  ? 02/14/22 0339 02/14/22 9924  ?BP: 115/75 (!) 141/85  ?Pulse: 80 73  ?Resp: 16 14  ?Temp: 98.6 ?F (37 ?C) 98.5 ?F (36.9 ?C)  ?SpO2: 92% 90%  ? ?General AA&O x3. Normal mood and affect.  ?Vascular Dorsalis pedis and posterior tibial pulses 2/4 bilat. ?Brisk capillary refill to all digits. Pedal hair present.  ?Neurologic Epicritic sensation grossly intact.  ?Dermatologic Dressing clean dry intact there is a full-thickness ulceration on the right hallux with  ?Orthopedic: MMT 5/5 in dorsiflexion, plantarflexion, inversion, and eversion. ?Normal joint ROM without pain or crepitus.  ? ?Ultrasound ABI with two-vessel runoff and no significant arterial disease ? ?MRI with pathologic fracture and likely osteomyelitis ?Assessment & Plan:  ?Patient was evaluated and treated and all questions answered. ? ?Osteomyelitis of right great toe ?-I reviewed the findings of the MRI and the noninvasive vascular testing with the patient in detail.  We discussed proceeding with surgical intervention including the likelihood of partial or full great toe amputation which she understands and is amenable to.  I did discuss with her that if her wound is relatively benign and does not communicate with the fracture site that is possible this is a traumatic fracture sustained secondary to her idiopathic peripheral neuropathy, however it is more than likely pathologic secondary to osteomyelitis.  If it does appear to be a traumatic fracture then we will debride the ulcer and biopsy of the bone and culture it, if it appears to be osteomyelitis then we will proceed with amputation.  She understands this will be an  intraoperative decision and the risks benefits and potential complications could be associated with this procedure discussed.  Also advised smoking to maximize her chance of healing.  Plan for surgery today at 5 PM, maintain n.p.o. ? ?Edwin Cap, DPM ? ?Accessible via secure chat for questions or concerns. ? ?

## 2022-02-14 NOTE — Assessment & Plan Note (Signed)
-   Displaced fracture involving base of distal phalanx of the right great toe ?-See podiatry note and follow-up OR findings ?

## 2022-02-14 NOTE — Assessment & Plan Note (Signed)
-   Folate and B12 levels low - Continue B12 replacement at discharge 

## 2022-02-14 NOTE — Transfer of Care (Signed)
Immediate Anesthesia Transfer of Care Note ? ?Patient: [name] ? ?Procedure(s) Performed: AMPUTATION TOE (Right: Toe) ? ?Patient Location: PACU ? ?Anesthesia Type:General ? ?Level of Consciousness: awake, alert  and oriented ? ?Airway & Oxygen Therapy: Patient Spontanous Breathing and Patient connected to face mask oxygen ? ?Post-op Assessment: Report given to RN and Post -op Vital signs reviewed and stable ? ?Post vital signs: Reviewed and stable ? ?Last Vitals:  ?Vitals Value Taken Time  ?BP 86/62 02/14/22 1804  ?Temp    ?Pulse 87 02/14/22 1806  ?Resp 17 02/14/22 1806  ?SpO2 98 % 02/14/22 1806  ?Vitals shown include unvalidated device data. ? ?Last Pain:  ?Vitals:  ? 02/14/22 1651  ?TempSrc: Temporal  ?PainSc: 0-No pain  ?   ? ?Patients Stated Pain Goal: 2 (02/13/22 1858) ? ?Complications: No notable events documented. ?

## 2022-02-14 NOTE — Assessment & Plan Note (Signed)
-   Continue albuterol PRN ?

## 2022-02-14 NOTE — Progress Notes (Signed)
Patient given post-op shoe 

## 2022-02-14 NOTE — Brief Op Note (Signed)
02/14/2022 ? ?6:03 PM ? ?PATIENT:  Shantrell Placzek  56 y.o. female ? ?PRE-OPERATIVE DIAGNOSIS:  Right great toe osteomyelitis ? ?POST-OPERATIVE DIAGNOSIS:  Right great toe osteomyelitis ? ?PROCEDURE:  Procedure(s): ?AMPUTATION TOE (Right) ? ?SURGEON:  Surgeon(s) and Role: ?   * Ranette Luckadoo, Rachelle Hora, DPM - Primary ? ? ?ASSISTANTS: none  ? ?ANESTHESIA:   local and IV sedation ? ?EBL:  <10cc  ? ?BLOOD ADMINISTERED:none ? ?DRAINS: none  ? ?LOCAL MEDICATIONS USED:  MARCAINE    and Amount: 10 ml ? ?SPECIMEN:  hallux right ? ?DISPOSITION OF SPECIMEN:  pathology and micro bone culture ? ?COUNTS:  YES ? ?TOURNIQUET:  * Missing tourniquet times found for documented tourniquets in log: 016010 * ? ?DICTATION: .Note written in EPIC ? ?PLAN OF CARE: Admit to inpatient  ? ?PATIENT DISPOSITION:  PACU - hemodynamically stable. ?  ?Delay start of Pharmacological VTE agent (>24hrs) due to surgical blood loss or risk of bleeding: no ? ?

## 2022-02-14 NOTE — Hospital Course (Signed)
Ms. Plautz is a 56 year old female with PMH emphysema, ongoing tobacco use who presented with an ongoing wound involving her right foot first digit.  She had cut a callus off with a box cutter and had difficulty with the wound healing ever since. ?MRI was obtained which was consistent with osteomyelitis involving the great toe.  She was evaluated by podiatry with recommendations for amputation.  Antibiotics were initiated on admission as well. ?

## 2022-02-14 NOTE — Anesthesia Procedure Notes (Signed)
Procedure Name: [place] ?Date/Time: 02/14/2022 5:20 PM ?Performed by: Tollie Eth, CRNA ?Pre-anesthesia Checklist: Patient identified, Emergency Drugs available, Suction available and Patient being monitored ?Patient Re-evaluated:Patient Re-evaluated prior to induction ?Oxygen Delivery Method: Simple face mask ?Induction Type: IV induction ?Placement Confirmation: positive ETCO2 ? ? ? ? ?

## 2022-02-14 NOTE — Assessment & Plan Note (Signed)
-   Continue cefepime ?- Follow-up OR findings ?

## 2022-02-14 NOTE — Op Note (Signed)
Patient Name: [name] ?DOB: 1966-04-13  ?MRN: [id number] ?  ?Date of Service: 02/13/2022 - 02/14/2022 ? ?Surgeon: Dr. Sharl Ma, DPM ?Assistants: None ?Pre-operative Diagnosis:  ?Right hallux osteomyelitis ?Post-operative Diagnosis:  ?Right hallux osteomyelitis ?Procedures: ? 1) amputation MTPJ right great toe ?Pathology/Specimens: ?ID Type Source Tests Collected by Time Destination  ?1 : Right Great toe Tissue PATH Digit amputation SURGICAL PATHOLOGY Edwin Cap, DPM 02/14/2022 1737   ?A : Right Great toe bone culture Tissue PATH Digit amputation AEROBIC/ANAEROBIC CULTURE W GRAM STAIN (SURGICAL/DEEP WOUND) Edwin Cap, DPM 02/14/2022 1738   ? ?Anesthesia: IV sedation w/ local block ?Hemostasis: * Missing tourniquet times found for documented tourniquets in log: 810175 * ?Estimated Blood Loss: <10cc ?Materials: * No implants in log * ?Medications: 60mL marcaine 0.5% plain ?Complications: none ? ?Intraoperative findings: purulent necrosis of distal and proximal phalanx, perfusion satisfactory ? ?Indications for Procedure:  ?This is a 56 y.o. female with a history of a toe ulcer and idiopathic neuropathy with osteomyelitis of the great toe. Amputation was recommended. All questions were addressed prior to surgery. No guarantees were made ?  ?Procedure in Detail: ?Patient was identified in pre-operative holding area. Formal consent was signed and the righ lower extremity was marked. Patient was brought back to the operating room. Anesthesia was induced. The extremity was prepped and draped in the usual sterile fashion. Timeout was taken to confirm patient name, laterality, and procedure prior to incision.  ? ?Attention was then directed to the right great toe where an incision was made in a fish mouth style incision. Dissection was carried down to level of bone.  Dissection was continued to the metatarsophalangeal joint and all collateral ligaments were freed at the joint.  The bone soft tissue  attachments of the proximal phalanx were removed and passed for pathology. There was purulent drainage from the bone with necrosis, this was cultured. The remaining metatarsal head appeared healthy and viable.  The area was copiously irrigated. Hemostasis was achieved The skin was reapproximated with monocryl nylon and skin staples. ? ? ?The foot was then dressed with xeroform DSD and ACE. Patient tolerated the procedure well. ?  ?Disposition: ?Following a period of post-operative monitoring, patient will be transferred to the floor, she will be stable for discharge on PO antibiotics. She may weight bear in a post op shoe. ? ?

## 2022-02-15 ENCOUNTER — Encounter: Payer: Self-pay | Admitting: Podiatry

## 2022-02-15 LAB — BASIC METABOLIC PANEL
Anion gap: 8 (ref 5–15)
BUN: 12 mg/dL (ref 6–20)
CO2: 25 mmol/L (ref 22–32)
Calcium: 9 mg/dL (ref 8.9–10.3)
Chloride: 100 mmol/L (ref 98–111)
Creatinine, Ser: 0.62 mg/dL (ref 0.44–1.00)
GFR, Estimated: >60 mL/min (ref >60)
Glucose, Bld: 89 mg/dL (ref 70–99)
Potassium: 4 mmol/L (ref 3.5–5.1)
Sodium: 133 mmol/L — ABNORMAL LOW (ref 135–145)

## 2022-02-15 LAB — CBC WITH DIFFERENTIAL/PLATELET
Abs Immature Granulocytes: 0.02 10*3/uL (ref 0.00–0.07)
Basophils Absolute: 0 10*3/uL (ref 0.0–0.1)
Basophils Relative: 1 %
Eosinophils Absolute: 0.3 10*3/uL (ref 0.0–0.5)
Eosinophils Relative: 4 %
HCT: 36.7 % (ref 36.0–46.0)
Hemoglobin: 11.9 g/dL — ABNORMAL LOW (ref 12.0–15.0)
Immature Granulocytes: 0 %
Lymphocytes Relative: 33 %
Lymphs Abs: 2.1 10*3/uL (ref 0.7–4.0)
MCH: 33 pg (ref 26.0–34.0)
MCHC: 32.4 g/dL (ref 30.0–36.0)
MCV: 101.7 fL — ABNORMAL HIGH (ref 80.0–100.0)
Monocytes Absolute: 0.5 10*3/uL (ref 0.1–1.0)
Monocytes Relative: 8 %
Neutro Abs: 3.4 10*3/uL (ref 1.7–7.7)
Neutrophils Relative %: 54 %
Platelets: 268 10*3/uL (ref 150–400)
RBC: 3.61 MIL/uL — ABNORMAL LOW (ref 3.87–5.11)
RDW: 13.7 % (ref 11.5–15.5)
WBC: 6.4 10*3/uL (ref 4.0–10.5)
nRBC: 0 % (ref 0.0–0.2)

## 2022-02-15 LAB — MAGNESIUM: Magnesium: 1.9 mg/dL (ref 1.7–2.4)

## 2022-02-15 MED ORDER — OXYCODONE HCL 5 MG PO TABS
5.0000 mg | ORAL_TABLET | Freq: Three times a day (TID) | ORAL | 0 refills | Status: DC | PRN
Start: 2022-02-15 — End: 2022-02-23

## 2022-02-15 MED ORDER — DOXYCYCLINE HYCLATE 100 MG PO TABS: 100.0000 mg | ORAL_TABLET | Freq: Two times a day (BID) | ORAL | 0 refills | Status: DC

## 2022-02-15 MED ORDER — NICOTINE 21 MG/24HR TD PT24
21.0000 mg | MEDICATED_PATCH | Freq: Every day | TRANSDERMAL | 0 refills | Status: AC
Start: 2022-02-15 — End: ?

## 2022-02-15 MED ORDER — DOXYCYCLINE HYCLATE 100 MG PO TABS
100.0000 mg | ORAL_TABLET | Freq: Two times a day (BID) | ORAL | Status: DC
  Administered 2022-02-15: 100 mg via ORAL
  Filled 2022-02-15: qty 1

## 2022-02-15 NOTE — Discharge Instructions (Signed)
Patient advised to keep the current dressing on till seen in the office by Dr. Abbott Pao ? ?call Dr. Lilian Kapur office in case dressing gets wet or needs earlier replacement. ?

## 2022-02-15 NOTE — Plan of Care (Signed)

## 2022-02-15 NOTE — Discharge Summary (Signed)
?Physician Discharge Summary ?  ?Patient: [name] MRN: [id number] DOB: [birth year]  ?Admit date:     02/13/2022  ?Discharge date: 02/15/22  ?Discharge Physician: Fritzi Mandes  ? ?PCP: Patient, No Pcp Per (Inactive)  ? ?Recommendations at discharge:  ? ?follow-up Dr. Sherryle Lis podiatry on 29th March ?the current dressing on per podiatry instruction. If needed any help or needs change early call his office ?patient advised smoking cessation ?use postop boot while ambulation. Weight-bearing as tolerated ? ?Discharge Diagnoses: ?Osteomyelitis of the right great toe ? ?Hospital Course: ?[name] is a 56 year old female with PMH emphysema, ongoing tobacco use who presented with an ongoing wound involving her right foot first digit.  She had cut a callus off with a box cutter and had difficulty with the wound healing ever since. ?MRI was obtained which was consistent with osteomyelitis involving the great toe.  She was evaluated by podiatry with recommendations for amputation ? ?osteomyelitis of great toe of right foot ?right to distal phalanx fracture ?-- MRI does perform on admission showed osteomyelitis involving distant phenolics of the right great toe ?-- ABI is obtaining abnormal ?-- patient was seen by podiatry Dr. Lynnell Catalan. Underwent amputation of right great toe on 20th March ?-- she was continued on broad-spectrum antibiotic. Per Dr. Lynnell Catalan change to doxycycline hundred milligrams BID for 14 days ?-- current dressing remains till seen by podiatry as outpatient on 29th of March. Patient will call podiatry office if dressing gets soggy or wet ? ?COPD with ongoing tobacco abuse ?-- advised smoking cessation ?-- nicotine patch offered ? ?patient able to ambulate using postop boot. Discussed with Dr. Sherryle Lis okay to discharge from podiatry standpoint with outpatient follow-up on 29th March ? ?  ? ? ?Consultants: dietary Dr. Sherryle Lis ?Procedures performed: right great toe amputation ?Disposition: Home ?Diet  recommendation:  ?Discharge Diet Orders (From admission, onward)  ? ?  Start     Ordered  ? 02/15/22 0000  Diet - low sodium heart healthy       ? 02/15/22 0805  ? ?  ?  ? ?  ? ?Regular diet ?DISCHARGE MEDICATION: ?Allergies as of 02/15/2022   ?No Known Allergies ?  ? ?  ?Medication List  ?  ? ?STOP taking these medications   ? ?HYDROcodone-acetaminophen 5-325 MG tablet ?Commonly known as: NORCO/VICODIN ?  ? ?  ? ?TAKE these medications   ? ?acetaminophen 500 MG tablet ?Commonly known as: TYLENOL ?Take 500-1,000 mg by mouth every 6 (six) hours as needed for mild pain or moderate pain. ?  ?doxycycline 100 MG tablet ?Commonly known as: VIBRA-TABS ?Take 1 tablet (100 mg total) by mouth every 12 (twelve) hours for 14 days. ?  ?gabapentin 300 MG capsule ?Commonly known as: NEURONTIN ?Take 300 mg by mouth 3 (three) times daily as needed. ?  ?nicotine 21 mg/24hr patch ?Commonly known as: NICODERM CQ - dosed in mg/24 hours ?Place 1 patch (21 mg total) onto the skin daily. ?  ?oxyCODONE 5 MG immediate release tablet ?Commonly known as: Oxy IR/ROXICODONE ?Take 1 tablet (5 mg total) by mouth every 8 (eight) hours as needed for moderate pain. ?  ? ?  ? ?  ?  ? ? ?  ?Discharge Care Instructions  ?(From admission, onward)  ?  ? ? ?  ? ?  Start     Ordered  ? 02/15/22 0000  Discharge wound care:       ?Comments: Per Dr Rowland Lathe the current dressing till seen in the office  on 02/23/2022.  ? 02/15/22 0805  ? ?  ?  ? ?  ? ? ?Discharge Exam: ?Filed Weights  ? 02/13/22 1208 02/14/22 1651  ?Weight: 90.7 kg 90.7 kg  ? ? ? ?Condition at discharge: fair ? ?The results of significant diagnostics from this hospitalization (including imaging, microbiology, ancillary and laboratory) are listed below for reference.  ? ?Imaging Studies: ?MR TOES RIGHT WO CONTRAST ? ?Result Date: 02/14/2022 ?CLINICAL DATA:  Foot swelling, diabetic, osteomyelitis suspected, xray done EXAM: MRI OF THE RIGHT TOES WITHOUT CONTRAST TECHNIQUE: Multiplanar,  multisequence MR imaging of the right toes was performed. No intravenous contrast was administered. COMPARISON:  Right toe radiograph 02/13/2022 FINDINGS: Bones/Joint/Cartilage There is marrow edema and confluent low T1 signal within the distal and proximal phalanx of the great toe centered at the interphalangeal joint. There is a displaced fracture involving the base of the distal phalanx. No other abnormal marrow signal. Ligaments Motion artifact limits evaluation of ligamentous structures. Muscles and Tendons Diffuse intramuscular edema muscle atrophy in the foot as is commonly seen in diabetics. Soft tissues Diffuse soft tissue swelling of the foot. There is a soft tissue ulcer along the plantar aspect of the great toe. IMPRESSION: Marrow edema and confluent low T1 signal within the distal and proximal phalanx of the great toe, consistent with osteomyelitis. Some of this signal in the distal phalanx can be attributable to the displaced fracture at the base of the distal phalanx as seen on comparison radiograph, however given the additional abnormal signal in the proximal phalanx and adjacent soft tissue ulcer, osteomyelitis is likely. No well-defined/drainable soft tissue abscess. Electronically Signed   By: Maurine Simmering M.D.   On: 02/14/2022 08:11  ? ?US ARTERIAL ABI (SCREENING LOWER EXTREMITY) ? ?Result Date: 02/14/2022 ?CLINICAL DATA:  Right great toe ulcer Current smoker Right breast pain EXAM: NONINVASIVE PHYSIOLOGIC VASCULAR STUDY OF BILATERAL LOWER EXTREMITIES TECHNIQUE: Evaluation of both lower extremities were performed at rest, including calculation of ankle-brachial indices with single level Doppler, pressure and pulse volume recording. COMPARISON:  None. FINDINGS: Right ABI:  1.25 Left ABI:  1.19 Right Lower Extremity: Multiphasic waveform seen in the posterior tibial artery. Monophasic waveform seen in the dorsalis pedis artery. Left Lower Extremity:  Normal arterial waveforms at the ankle. 1.0-1.4  Normal IMPRESSION: No definitive evidence of significant lower extremity arterial occlusive disease. Electronically Signed   By: Miachel Roux M.D.   On: 02/14/2022 09:52  ? ?DG Toe Great Right ? ?Result Date: 02/13/2022 ?CLINICAL DATA:  Peripheral neuropathy. Possible infection in right great toe. EXAM: RIGHT GREAT TOE COMPARISON:  None. FINDINGS: Two bony fragments are identified medial to the proximal aspect of the distal first phalanx. There is a donor site from the adjacent phalanx. No other fractures identified. Soft tissue swelling noted. An apparent ulcer is seen in the region of the great toe best seen on the lateral view. No other bony abnormalities. IMPRESSION: Two bony fragments are identified medial to the proximal aspect of the distal first phalanx consistent with displaced fracture fragments, age indeterminate. The margins of the donor site are mildly irregular and ill-defined. The fracture fragments are mildly irregular as well. Given apparent signs of infection, osteomyelitis at the site of an age indeterminate fracture would be difficult to exclude. The findings could all be secondary to an acute fracture. Recommend clinical correlation. Electronically Signed   By: Dorise Bullion III M.D.   On: 02/13/2022 13:16   ? ?Microbiology: ?Results for orders placed or performed during  the hospital encounter of 02/13/22  ?Blood Culture (routine x 2)     Status: None (Preliminary result)  ? Collection Time: 02/13/22  1:33 PM  ? Specimen: BLOOD  ?Result Value Ref Range Status  ? Specimen Description BLOOD RIGHT ANTECUBITAL  Final  ? Special Requests   Final  ?  BOTTLES DRAWN AEROBIC AND ANAEROBIC Blood Culture results may not be optimal due to an excessive volume of blood received in culture bottles  ? Culture   Final  ?  NO GROWTH 2 DAYS ?Performed at [facility], [address] [place], [place] [address] ?  ? Report Status PENDING  Incomplete  ?Blood Culture (routine x 2)     Status: None  (Preliminary result)  ? Collection Time: 02/13/22  2:37 PM  ? Specimen: BLOOD  ?Result Value Ref Range Status  ? Specimen Description BLOOD RAC  Final  ? Special Requests BOTTLES DRAWN AEROBIC AND ANAEROBIC BCLV  Final  ? Cu

## 2022-02-16 LAB — SURGICAL PATHOLOGY

## 2022-02-18 LAB — CULTURE, BLOOD (ROUTINE X 2)
Culture: NO GROWTH
Culture: NO GROWTH

## 2022-02-19 LAB — AEROBIC/ANAEROBIC CULTURE W GRAM STAIN (SURGICAL/DEEP WOUND)

## 2022-02-23 ENCOUNTER — Other Ambulatory Visit: Payer: Self-pay

## 2022-02-23 ENCOUNTER — Encounter: Payer: Self-pay | Admitting: Podiatry

## 2022-02-23 ENCOUNTER — Ambulatory Visit (INDEPENDENT_AMBULATORY_CARE_PROVIDER_SITE_OTHER): Payer: Self-pay | Admitting: Podiatry

## 2022-02-23 ENCOUNTER — Ambulatory Visit: Payer: MEDICAID

## 2022-02-23 VITALS — BP 134/70 | HR 83 | Temp 97.9°F

## 2022-02-23 DIAGNOSIS — M869 Osteomyelitis, unspecified: Secondary | ICD-10-CM

## 2022-02-23 DIAGNOSIS — Z9889 Other specified postprocedural states: Secondary | ICD-10-CM

## 2022-02-23 MED ORDER — OXYCODONE HCL 5 MG PO TABS: 5.0000 mg | ORAL_TABLET | Freq: Three times a day (TID) | ORAL | 0 refills | Status: AC | PRN

## 2022-02-23 MED ORDER — AMOXICILLIN-POT CLAVULANATE 875-125 MG PO TABS: 1.0000 | ORAL_TABLET | Freq: Two times a day (BID) | ORAL | 0 refills | Status: AC

## 2022-02-23 MED ORDER — GABAPENTIN 300 MG PO CAPS
300.0000 mg | ORAL_CAPSULE | Freq: Three times a day (TID) | ORAL | 0 refills | Status: DC
Start: 2022-02-23 — End: 2022-03-09

## 2022-02-25 ENCOUNTER — Encounter: Payer: Self-pay | Admitting: Podiatry

## 2022-02-25 NOTE — Progress Notes (Signed)
?  Subjective:  ?Patient ID: [name], female    DOB: 1966-06-27,  MRN: [id number] ? ?Chief Complaint  ?Patient presents with  ? Routine Post Op  ? ? ?DOS: 02/14/2022 ?Procedure: Right hallux amputation ? ?56 y.o. female returns for post-op check.  Pain is improving is mostly around the incision. ? ?Review of Systems: Negative except as noted in the HPI. Denies N/V/F/Ch. ? ? ?Objective:  ? ?Vitals:  ? 02/23/22 1038  ?BP: 134/70  ?Pulse: 83  ?Temp: 97.9 ?F (36.6 ?C)  ? ?There is no height or weight on file to calculate BMI. ?Constitutional Well developed. ?Well nourished.  ?Vascular Foot warm and well perfused. ?Capillary refill normal to all digits.  Calf is soft and supple, no posterior calf or knee pain, negative Homans' sign  ?Neurologic Normal speech. ?Oriented to person, place, and time. ?Epicritic sensation to light touch grossly present bilaterally.  ?Dermatologic Incision remains coapted.  There is an area of central delayed healing.  Serous drainage.  Slight erythema around the incision.  ?Orthopedic: Tenderness to palpation noted about the surgical site.  ? ?Path with osteomyelitis with clean resection margin ? ?Culture with multiple agents including strep agalactiae and Pasteurella ?Assessment:  ? ?1. Osteomyelitis of great toe of right foot (HCC)   ?2. S/P foot surgery, right   ? ?Plan:  ?Patient was evaluated and treated and all questions answered. ? ?S/p foot surgery right ?-Incision remains coapted there is an area of central delayed healing.  She did have some erythema and drainage I placed her back on Augmentin. ?-WB Status: WBAT in surgical shoe ?-Sutures: Plan to remove at next visit. ?-Medications: I have refilled her gabapentin and oxycodone for pain as well as the Augmentin discussed above ?-Foot redressed. ? ?Return in about 2 weeks (around 03/09/2022) for suture removal.  ?

## 2022-03-09 ENCOUNTER — Ambulatory Visit (INDEPENDENT_AMBULATORY_CARE_PROVIDER_SITE_OTHER): Payer: Self-pay | Admitting: Podiatry

## 2022-03-09 DIAGNOSIS — M869 Osteomyelitis, unspecified: Secondary | ICD-10-CM

## 2022-03-09 DIAGNOSIS — Z89411 Acquired absence of right great toe: Secondary | ICD-10-CM

## 2022-03-09 MED ORDER — GABAPENTIN 300 MG PO CAPS: 300.0000 mg | ORAL_CAPSULE | Freq: Three times a day (TID) | ORAL | 0 refills | Status: AC

## 2022-03-09 NOTE — Progress Notes (Signed)
?  Subjective:  ?Patient ID: [name], female    DOB: 06-Apr-1966,  MRN: [id number] ? ?Chief Complaint  ?Patient presents with  ? Routine Post Op  ?  POV #2 DOS  02/14/22  AMPUTATION MTPJ GREAT RT / SUTURE REMOVAL  ? ? ?DOS: 02/14/2022 ?Procedure: Right hallux amputation ? ?56 y.o. female returns for post-op check.  She is doing okay the gabapentin has been helpful ? ?Review of Systems: Negative except as noted in the HPI. Denies N/V/F/Ch. ? ? ?Objective:  ? ?There were no vitals filed for this visit. ? ?There is no height or weight on file to calculate BMI. ?Constitutional Well developed. ?Well nourished.  ?Vascular Foot warm and well perfused. ?Capillary refill normal to all digits.  Calf is soft and supple, no posterior calf or knee pain, negative Homans' sign  ?Neurologic Normal speech. ?Oriented to person, place, and time. ?Epicritic sensation to light touch grossly present bilaterally.  ?Dermatologic Incision is well-healed not hypertrophic  ?Orthopedic: Tenderness to palpation noted about the surgical site.  ? ?Path with osteomyelitis with clean resection margin ? ?Culture with multiple agents including strep agalactiae and Pasteurella ?Assessment:  ? ?1. Acquired absence of great toe, right (HCC)   ?2. Osteomyelitis of great toe of right foot (HCC)   ? ?Plan:  ?Patient was evaluated and treated and all questions answered. ? ?S/p foot surgery right ?-Doing much better today.  The incision is fully healed I removed all sutures and staples.  She may return to regular shoe gear I wrote her a prescription for an amputation filler and insert to be made at [facility].  She will return to see me as needed.  Advised on signs symptoms of recurrence or other ulcerations.  I also refilled her gabapentin for 90-day supply until she can establish with a PCP to manage her peripheral neuropathy ? ?Return if symptoms worsen or fail to improve.  ?

## 2023-11-09 DIAGNOSIS — I509 Heart failure, unspecified: Secondary | ICD-10-CM | POA: Diagnosis not present

## 2023-11-12 DIAGNOSIS — R001 Bradycardia, unspecified: Secondary | ICD-10-CM

## 2023-12-30 DEATH — deceased
